# Patient Record
Sex: Female | Born: 1975 | Race: White | Hispanic: No | Marital: Married | Smoking: Never smoker
Health system: Southern US, Community
[De-identification: ages and names within clinical notes are randomized; demographics above are authoritative.]

## PROBLEM LIST (undated history)

## (undated) DIAGNOSIS — L501 Idiopathic urticaria: Secondary | ICD-10-CM

## (undated) DIAGNOSIS — R87619 Unspecified abnormal cytological findings in specimens from cervix uteri: Secondary | ICD-10-CM

## (undated) DIAGNOSIS — Q992 Fragile X chromosome: Secondary | ICD-10-CM

## (undated) DIAGNOSIS — N979 Female infertility, unspecified: Secondary | ICD-10-CM

## (undated) DIAGNOSIS — R896 Abnormal cytological findings in specimens from other organs, systems and tissues: Secondary | ICD-10-CM

## (undated) DIAGNOSIS — B977 Papillomavirus as the cause of diseases classified elsewhere: Secondary | ICD-10-CM

## (undated) DIAGNOSIS — IMO0002 Reserved for concepts with insufficient information to code with codable children: Secondary | ICD-10-CM

## (undated) DIAGNOSIS — K469 Unspecified abdominal hernia without obstruction or gangrene: Secondary | ICD-10-CM

## (undated) DIAGNOSIS — Z973 Presence of spectacles and contact lenses: Secondary | ICD-10-CM

## (undated) HISTORY — DX: Presence of spectacles and contact lenses: Z97.3

## (undated) HISTORY — DX: Unspecified abnormal cytological findings in specimens from cervix uteri: R87.619

## (undated) HISTORY — DX: Idiopathic urticaria: L50.1

## (undated) HISTORY — DX: Female infertility, unspecified: N97.9

## (undated) HISTORY — DX: Reserved for concepts with insufficient information to code with codable children: IMO0002

## (undated) HISTORY — DX: Abnormal cytological findings in specimens from other organs, systems and tissues: R89.6

## (undated) HISTORY — DX: Fragile x chromosome: Q99.2

## (undated) HISTORY — PX: ENDOMETRIAL BIOPSY: SHX622

## (undated) HISTORY — DX: Unspecified abdominal hernia without obstruction or gangrene: K46.9

## (undated) HISTORY — DX: Papillomavirus as the cause of diseases classified elsewhere: B97.7

---
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
## 2010-10-06 DIAGNOSIS — IMO0001 Reserved for inherently not codable concepts without codable children: Secondary | ICD-10-CM

## 2010-10-06 DIAGNOSIS — B977 Papillomavirus as the cause of diseases classified elsewhere: Secondary | ICD-10-CM

## 2010-10-06 HISTORY — DX: Papillomavirus as the cause of diseases classified elsewhere: B97.7

## 2010-10-06 HISTORY — DX: Reserved for inherently not codable concepts without codable children: IMO0001

## 2010-11-10 ENCOUNTER — Other Ambulatory Visit: Payer: Self-pay | Admitting: Gynecology

## 2010-11-10 ENCOUNTER — Ambulatory Visit (INDEPENDENT_AMBULATORY_CARE_PROVIDER_SITE_OTHER): Payer: Self-pay | Admitting: Gynecology

## 2010-11-10 ENCOUNTER — Other Ambulatory Visit (HOSPITAL_COMMUNITY)
Admission: RE | Admit: 2010-11-10 | Discharge: 2010-11-10 | Disposition: A | Discharge status: Home / Self Care | Payer: BC Managed Care – PPO | Source: Ambulatory Visit | Attending: Gynecology | Admitting: Gynecology

## 2010-11-10 DIAGNOSIS — Z833 Family history of diabetes mellitus: Secondary | ICD-10-CM

## 2010-11-10 DIAGNOSIS — R87619 Unspecified abnormal cytological findings in specimens from cervix uteri: Secondary | ICD-10-CM | POA: Insufficient documentation

## 2010-11-10 DIAGNOSIS — Z13220 Encounter for screening for lipoid disorders: Secondary | ICD-10-CM

## 2010-11-10 DIAGNOSIS — Z01419 Encounter for gynecological examination (general) (routine) without abnormal findings: Secondary | ICD-10-CM

## 2010-11-20 ENCOUNTER — Encounter (INDEPENDENT_AMBULATORY_CARE_PROVIDER_SITE_OTHER): Payer: Self-pay | Admitting: Surgery

## 2010-11-20 ENCOUNTER — Ambulatory Visit (INDEPENDENT_AMBULATORY_CARE_PROVIDER_SITE_OTHER): Payer: BC Managed Care – PPO | Admitting: Surgery

## 2010-11-20 VITALS — BP 98/66 | HR 90 | Temp 99.2°F | Ht 67.0 in | Wt 140.6 lb

## 2010-11-20 DIAGNOSIS — R1909 Other intra-abdominal and pelvic swelling, mass and lump: Secondary | ICD-10-CM | POA: Insufficient documentation

## 2010-11-20 DIAGNOSIS — R1900 Intra-abdominal and pelvic swelling, mass and lump, unspecified site: Secondary | ICD-10-CM

## 2010-11-20 NOTE — Progress Notes (Signed)
Kathryn Walters is a 35 y.o. female.    Chief Complaint  Patient presents with  . Other    eval fem hernia    HPI HPI This is a pleasant 35 year old female who presents with a mass in her right groin. She has had this mass over a year. He was evaluated when she was living in [place] with an MRI. This appears to be a benign cystic mass and not a hernia. She showed me the reports in the office. The mass causes no discomfort. It has not been getting any larger.  Past Medical History  Diagnosis Date  . Wears glasses   . Hernia     Past Surgical History  Procedure Date  . Tonsillectomy 1982  . Left foot surgery 1989    Family History  Problem Relation Age of Onset  . Hypertension Mother     Social History History  Substance Use Topics  . Smoking status: Never Smoker   . Smokeless tobacco: Not on file  . Alcohol Use: 0.6 oz/week    1 Glasses of wine per week    Allergies  Allergen Reactions  . Aspirin Swelling  . Ibuprofen Swelling    No current outpatient prescriptions on file.    Review of Systems Review of Systems  Constitutional: Negative.   HENT: Negative.   Eyes: Negative.   Cardiovascular: Negative.   Gastrointestinal: Negative.   Genitourinary: Negative.   Musculoskeletal: Negative.   Skin: Negative.   Neurological: Negative.   Endo/Heme/Allergies: Negative.   Psychiatric/Behavioral: Negative.    Her 16 point review of systems is negative. Physical Exam Physical Exam  Constitutional: She appears well-developed and well-nourished. No distress.  HENT:  Head: Normocephalic and atraumatic.  Right Ear: External ear normal.  Left Ear: External ear normal.  Nose: Nose normal.  Mouth/Throat: Oropharynx is clear and moist. No oropharyngeal exudate.  Eyes: Conjunctivae and EOM are normal. Pupils are equal, round, and reactive to light. No scleral icterus.  Neck: Normal range of motion. Neck supple. No tracheal deviation present. No thyromegaly present.    Cardiovascular: Normal rate and regular rhythm.   Respiratory: Effort normal and breath sounds normal. No respiratory distress. She has no wheezes.  GI: Soft. Bowel sounds are normal. She exhibits mass. She exhibits no distension. There is no tenderness.       Small nontender mass just below right inguinal ligament. mobile  Skin: Skin is warm and dry. No rash noted. No erythema.  Psychiatric: Her behavior is normal. Thought content normal.     Blood pressure 98/66, pulse 90, temperature 99.2 F (37.3 C), height 5\' 7"  (1.702 m), weight 140 lb 9.6 oz (63.776 kg).  Assessment/Plan This is a 35 year old female with a right groin mass which I suspect is a benign cyst although I cannot totally rule out a femoral hernia. Again this is nontender. She incidentally does also have anal skin tags. I believe it is reasonable to follow this cyst expectantly as she is not here to have it excised. She is currently trying to get pregnant. I will place her on steroid cream to see if a will shrink skin tags. She will call back should she decide to have been removed the groin cyst.  Nnenna Meador A 11/20/2010, 4:42 PM

## 2011-05-02 ENCOUNTER — Ambulatory Visit (INDEPENDENT_AMBULATORY_CARE_PROVIDER_SITE_OTHER): Payer: BC Managed Care – PPO | Admitting: Gynecology

## 2011-05-02 ENCOUNTER — Other Ambulatory Visit (HOSPITAL_COMMUNITY)
Admission: RE | Admit: 2011-05-02 | Discharge: 2011-05-02 | Disposition: A | Discharge status: Home / Self Care | Payer: BC Managed Care – PPO | Source: Ambulatory Visit | Attending: Gynecology | Admitting: Gynecology

## 2011-05-02 ENCOUNTER — Encounter: Payer: Self-pay | Admitting: Gynecology

## 2011-05-02 VITALS — BP 104/68

## 2011-05-02 DIAGNOSIS — Z01419 Encounter for gynecological examination (general) (routine) without abnormal findings: Secondary | ICD-10-CM | POA: Insufficient documentation

## 2011-05-02 DIAGNOSIS — R87610 Atypical squamous cells of undetermined significance on cytologic smear of cervix (ASC-US): Secondary | ICD-10-CM

## 2011-05-02 NOTE — Progress Notes (Signed)
Patient presents for Pap smear. She has history of low-grade SIL done elsewhere with colposcopy and they were following her expectantly.  She had a Pap smear June 2012 which was ASCUS with positive high-risk HPV and I recommended she follow up for repeat Pap smear in 6 months.  Exam External BUS vagina normal. Cervix normal Pap done.   Assessment and plan: History of low-grade SIL. Most recent Pap smear showed ASCUS with positive high-risk HPV in June 2012. Pap done today. Persistent abnormal then we'll recommend colposcopy. If normal then we'll recommend repeat in 6 months at her annual. Patient understands the plan. We had a long discussion about dysplasia high-grade low-grade progression regression positive HPV Association.

## 2011-05-02 NOTE — Patient Instructions (Signed)
Follow up for Pap smear results. If abnormal then we'll plan colposcopy as discussed.

## 2011-05-08 DIAGNOSIS — IMO0002 Reserved for concepts with insufficient information to code with codable children: Secondary | ICD-10-CM

## 2011-05-08 HISTORY — DX: Reserved for concepts with insufficient information to code with codable children: IMO0002

## 2011-05-08 HISTORY — PX: COLPOSCOPY: SHX161

## 2011-05-21 ENCOUNTER — Encounter: Payer: Self-pay | Admitting: Gynecology

## 2011-05-21 ENCOUNTER — Ambulatory Visit (INDEPENDENT_AMBULATORY_CARE_PROVIDER_SITE_OTHER): Payer: BC Managed Care – PPO | Admitting: Gynecology

## 2011-05-21 DIAGNOSIS — IMO0002 Reserved for concepts with insufficient information to code with codable children: Secondary | ICD-10-CM | POA: Insufficient documentation

## 2011-05-21 DIAGNOSIS — R87612 Low grade squamous intraepithelial lesion on cytologic smear of cervix (LGSIL): Secondary | ICD-10-CM

## 2011-05-21 NOTE — Patient Instructions (Signed)
Follow up for biopsy results. If low-grade atypia then we'll plan repeat Pap smear July 2013 when you're due for your annual exam. High-grade atypia then we'll discuss treatment options.

## 2011-05-21 NOTE — Progress Notes (Signed)
Patient ID: Kathryn Walters, female   DOB: [birth year], 36 y.o.   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 This was done in [place]. 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 She her Pap smear repeated now in December and it showed low-grade SIL she presents for colposcopy.  Colposcopy with Sherri chaperone assisting Adequate after acetic acid cleansing with a small area of acetowhite change at the 12:00 transformation zone. Biopsy of this area taken with follow up ECC. Patient tolerated well Physical Exam  Genitourinary:     Assessment and plan: Low-grade SIL Pap smear, acetowhite change at 12:00 biopsy taken ECC performed. Patient will follow up for biopsy results.  If low grade or normal then we'll plan expectant management with Pap smear in 6 months when she is due for her annual exam. High-grade that we'll discuss treatment options.  The issues of dysplasia, high-grade low-grade, progression or regression, HPV association were all reviewed again with her.  Importance of follow up was stressed.

## 2011-09-17 ENCOUNTER — Encounter (INDEPENDENT_AMBULATORY_CARE_PROVIDER_SITE_OTHER): Payer: Self-pay | Admitting: Surgery

## 2011-09-17 ENCOUNTER — Ambulatory Visit (INDEPENDENT_AMBULATORY_CARE_PROVIDER_SITE_OTHER): Payer: BC Managed Care – PPO | Admitting: Surgery

## 2011-09-17 VITALS — BP 109/64 | HR 72 | Temp 98.7°F | Resp 12 | Ht 67.0 in | Wt 135.4 lb

## 2011-09-17 DIAGNOSIS — R1909 Other intra-abdominal and pelvic swelling, mass and lump: Secondary | ICD-10-CM

## 2011-09-17 NOTE — Progress Notes (Signed)
Subjective:     Patient ID: Kathryn Walters, female   DOB: 1976/04/29, 36 y.o.   MRN: [id number]  HPI She is here for a followup regarding her right groin mass. This was diagnosed in [place] and I saw her last year regarding it. She had an MRI which showed this to be a possible cyst. It did not appear like a hernia. She has had no discomfort since I saw her last.   originally she wanted to follow this expectantly  Review of Systems     Objective:   Physical Exam    Findings exam, there is a 2 cm soft mobile mass just below the inguinal ligament on the right side and immediately Assessment:     Right groin mass of uncertain etiology    Plan:     Removal in the operating room as recommended. I could not totally rule out that this is not a femoral hernia. I explained the risks of surgery to her which includes but is not limited to bleeding, infection, recurrence, chronic pain, need to use mesh and is being hernia, etc. She understands and wishes to proceed. Likelihood of success is good

## 2011-10-22 ENCOUNTER — Encounter (HOSPITAL_COMMUNITY): Payer: Self-pay | Admitting: Pharmacy Technician

## 2011-10-22 ENCOUNTER — Other Ambulatory Visit (HOSPITAL_COMMUNITY): Payer: BC Managed Care – PPO

## 2011-10-23 ENCOUNTER — Encounter (HOSPITAL_COMMUNITY)
Admission: RE | Admit: 2011-10-23 | Discharge: 2011-10-23 | Disposition: A | Discharge status: Home / Self Care | Payer: BC Managed Care – PPO | Source: Ambulatory Visit | Attending: Surgery | Admitting: Surgery

## 2011-10-23 ENCOUNTER — Encounter (HOSPITAL_COMMUNITY): Payer: Self-pay

## 2011-10-23 ENCOUNTER — Inpatient Hospital Stay (HOSPITAL_COMMUNITY): Admission: RE | Admit: 2011-10-23 | Payer: BC Managed Care – PPO | Source: Ambulatory Visit

## 2011-10-23 LAB — CBC
HCT: 36.5 % (ref 36.0–46.0)
MCHC: 33.7 g/dL (ref 30.0–36.0)
Platelets: 325 10*3/uL (ref 150–400)
RDW: 12.6 % (ref 11.5–15.5)
WBC: 4.6 10*3/uL (ref 4.0–10.5)

## 2011-10-23 LAB — SURGICAL PCR SCREEN: Staphylococcus aureus: POSITIVE — AB

## 2011-10-23 LAB — HCG, SERUM, QUALITATIVE: Preg, Serum: NEGATIVE

## 2011-10-23 NOTE — Patient Instructions (Signed)
20 Kathryn Walters  10/23/2011   Your procedure is scheduled on:  10/25/11 0900am-1005am  Report to [facility] at 0630 AM.  Call this number if you have problems the morning of surgery: [phone]   Remember:   Do not eat food:After Midnight.  May have clear liquids:until Midnight .  Marland Kitchen  Take these medicines the morning of surgery with A SIP OF WATER:    Do not wear jewelry, make-up or nail polish.  Do not wear lotions, powders, or perfumes.   Do not shave 48 hours prior to surgery. .  Do not bring valuables to the hospital.  Contacts, dentures or bridgework may not be worn into surgery.      Patients discharged the day of surgery will not be allowed to drive home.  Name and phone number of your driver:  Special Instructions: CHG Shower Use Special Wash: 1/2 bottle night before surgery and 1/2 bottle morning of surgery. shower chin to toes with CHG.  Wash face and private parts with regular soap.    Please read over the following fact sheets that you were given: MRSA Information, coughing and deep breathing exercises, leg exercises

## 2011-10-23 NOTE — Pre-Procedure Instructions (Signed)
Teach Back Method used for preop appointment 

## 2011-10-24 NOTE — H&P (Signed)
  HPI  HPI  This is a pleasant 36 year old female who presents with a mass in her right groin. She has had this mass over a year. She was evaluated when she was living in [place] with an MRI. This appears to be a benign cystic mass and not a hernia. She showed me the reports in the office. The mass causes no discomfort. It has not been getting any larger.  Past Medical History   Diagnosis  Date   .  Wears glasses    .  Hernia     Past Surgical History   Procedure  Date   .  Tonsillectomy  1982   .  Left foot surgery  1989    Family History   Problem  Relation  Age of Onset   .  Hypertension  Mother     Social History  History   Substance Use Topics   .  Smoking status:  Never Smoker   .  Smokeless tobacco:  Not on file   .  Alcohol Use:  0.6 oz/week     1 Glasses of wine per week    Allergies   Allergen  Reactions   .  Aspirin  Swelling   .  Ibuprofen  Swelling    No current outpatient prescriptions on file.    Review of Systems  Review of Systems  Constitutional: Negative.  HENT: Negative.  Eyes: Negative.  Cardiovascular: Negative.  Gastrointestinal: Negative.  Genitourinary: Negative.  Musculoskeletal: Negative.  Skin: Negative.  Neurological: Negative.  Endo/Heme/Allergies: Negative.  Psychiatric/Behavioral: Negative.   Her 16 point review of systems is negative.  Physical Exam  Physical Exam  Constitutional: She appears well-developed and well-nourished. No distress.  HENT:  Head: Normocephalic and atraumatic.  Right Ear: External ear normal.  Left Ear: External ear normal.  Nose: Nose normal.  Mouth/Throat: Oropharynx is clear and moist. No oropharyngeal exudate.  Eyes: Conjunctivae and EOM are normal. Pupils are equal, round, and reactive to light. No scleral icterus.  Neck: Normal range of motion. Neck supple. No tracheal deviation present. No thyromegaly present.  Cardiovascular: Normal rate and regular rhythm.  Respiratory: Effort normal and breath  sounds normal. No respiratory distress. She has no wheezes.  GI: Soft. Bowel sounds are normal. She exhibits mass. She exhibits no distension. There is no tenderness.  Small nontender mass just below right inguinal ligament. mobile  Skin: Skin is warm and dry. No rash noted. No erythema.  Psychiatric: Her behavior is normal. Thought content normal.   Blood pressure 98/66, pulse 90, temperature 99.2 F (37.3 C), height 5\' 7"  (1.702 m), weight 140 lb 9.6 oz (63.776 kg).   Assessment/Plan:  Right groin mass of uncertain etiology.  Removal recommended for histologic evaluation to rule out malignancy.  Risks of bleeding, infection, recurrence, seroma, injury, chronic pain, etc discussed.  Likelihood of success is good.

## 2011-10-25 ENCOUNTER — Encounter (HOSPITAL_COMMUNITY): Payer: Self-pay | Admitting: Anesthesiology

## 2011-10-25 ENCOUNTER — Encounter (HOSPITAL_COMMUNITY): Payer: Self-pay | Admitting: *Deleted

## 2011-10-25 ENCOUNTER — Encounter (HOSPITAL_COMMUNITY)
Admission: RE | Disposition: A | Discharge status: Home / Self Care | Payer: Self-pay | Source: Ambulatory Visit | Attending: Surgery

## 2011-10-25 ENCOUNTER — Ambulatory Visit (HOSPITAL_COMMUNITY): Payer: BC Managed Care – PPO | Admitting: Anesthesiology

## 2011-10-25 ENCOUNTER — Ambulatory Visit (HOSPITAL_COMMUNITY)
Admission: RE | Admit: 2011-10-25 | Discharge: 2011-10-25 | Disposition: A | Discharge status: Home / Self Care | Payer: BC Managed Care – PPO | Source: Ambulatory Visit | Attending: Surgery | Admitting: Surgery

## 2011-10-25 DIAGNOSIS — Z01812 Encounter for preprocedural laboratory examination: Secondary | ICD-10-CM | POA: Insufficient documentation

## 2011-10-25 DIAGNOSIS — L723 Sebaceous cyst: Secondary | ICD-10-CM | POA: Insufficient documentation

## 2011-10-25 DIAGNOSIS — R1903 Right lower quadrant abdominal swelling, mass and lump: Secondary | ICD-10-CM | POA: Insufficient documentation

## 2011-10-25 HISTORY — PX: MASS EXCISION: SHX2000

## 2011-10-25 SURGERY — EXCISION MASS
Anesthesia: General | Site: Groin | Wound class: Clean

## 2011-10-25 MED ORDER — LACTATED RINGERS IV SOLN
INTRAVENOUS | Status: DC
  Administered 2011-10-25: 1000 mL via INTRAVENOUS

## 2011-10-25 MED ORDER — LACTATED RINGERS IV SOLN
INTRAVENOUS | Status: DC | PRN
  Administered 2011-10-25: 09:00:00 via INTRAVENOUS

## 2011-10-25 MED ORDER — PROPOFOL 10 MG/ML IV BOLUS
INTRAVENOUS | Status: DC | PRN
  Administered 2011-10-25: 150 mg via INTRAVENOUS

## 2011-10-25 MED ORDER — MORPHINE SULFATE 10 MG/ML IJ SOLN: 2.0000 mg | INTRAMUSCULAR | Status: DC | PRN

## 2011-10-25 MED ORDER — ACETAMINOPHEN 325 MG PO TABS: 650.0000 mg | ORAL_TABLET | ORAL | Status: DC | PRN

## 2011-10-25 MED ORDER — ONDANSETRON HCL 4 MG/2ML IJ SOLN
INTRAMUSCULAR | Status: DC | PRN
  Administered 2011-10-25: 4 mg via INTRAVENOUS

## 2011-10-25 MED ORDER — FENTANYL CITRATE 0.05 MG/ML IJ SOLN: 25.0000 ug | INTRAMUSCULAR | Status: DC | PRN

## 2011-10-25 MED ORDER — HYDROCODONE-ACETAMINOPHEN 5-325 MG PO TABS: 1.0000 | ORAL_TABLET | ORAL | Status: AC | PRN

## 2011-10-25 MED ORDER — OXYCODONE HCL 5 MG PO TABS: 5.0000 mg | ORAL_TABLET | ORAL | Status: DC | PRN

## 2011-10-25 MED ORDER — PROMETHAZINE HCL 25 MG/ML IJ SOLN: 6.2500 mg | INTRAMUSCULAR | Status: DC | PRN

## 2011-10-25 MED ORDER — LIDOCAINE HCL (CARDIAC) 20 MG/ML IV SOLN
INTRAVENOUS | Status: DC | PRN
  Administered 2011-10-25: 50 mg via INTRAVENOUS

## 2011-10-25 MED ORDER — BUPIVACAINE HCL (PF) 0.5 % IJ SOLN
INTRAMUSCULAR | Status: AC
  Filled 2011-10-25: qty 30

## 2011-10-25 MED ORDER — SODIUM CHLORIDE 0.9 % IJ SOLN: 3.0000 mL | INTRAMUSCULAR | Status: DC | PRN

## 2011-10-25 MED ORDER — ONDANSETRON HCL 4 MG/2ML IJ SOLN: 4.0000 mg | Freq: Four times a day (QID) | INTRAMUSCULAR | Status: DC | PRN

## 2011-10-25 MED ORDER — MIDAZOLAM HCL 5 MG/5ML IJ SOLN
INTRAMUSCULAR | Status: DC | PRN
  Administered 2011-10-25: 2 mg via INTRAVENOUS

## 2011-10-25 MED ORDER — LACTATED RINGERS IV SOLN: INTRAVENOUS | Status: DC

## 2011-10-25 MED ORDER — ACETAMINOPHEN 650 MG RE SUPP
650.0000 mg | RECTAL | Status: DC | PRN
  Filled 2011-10-25: qty 1

## 2011-10-25 MED ORDER — CEFAZOLIN SODIUM 1-5 GM-% IV SOLN
1.0000 g | INTRAVENOUS | Status: AC
  Administered 2011-10-25: 1 g via INTRAVENOUS

## 2011-10-25 MED ORDER — SODIUM CHLORIDE 0.9 % IV SOLN: 250.0000 mL | INTRAVENOUS | Status: DC | PRN

## 2011-10-25 MED ORDER — BUPIVACAINE HCL (PF) 0.5 % IJ SOLN
INTRAMUSCULAR | Status: DC | PRN
  Administered 2011-10-25: 10 mL

## 2011-10-25 MED ORDER — SODIUM CHLORIDE 0.9 % IJ SOLN: 3.0000 mL | Freq: Two times a day (BID) | INTRAMUSCULAR | Status: DC

## 2011-10-25 MED ORDER — ACETAMINOPHEN 10 MG/ML IV SOLN
INTRAVENOUS | Status: DC | PRN
  Administered 2011-10-25: 1000 mg via INTRAVENOUS

## 2011-10-25 SURGICAL SUPPLY — 38 items
BLADE HEX COATED 2.75 (ELECTRODE) ×3 IMPLANT
BLADE SURG 15 STRL LF DISP TIS (BLADE) ×2 IMPLANT
BLADE SURG 15 STRL SS (BLADE) ×1
BLADE SURG SZ10 CARB STEEL (BLADE) ×3 IMPLANT
CANISTER SUCTION 2500CC (MISCELLANEOUS) ×3 IMPLANT
CHLORAPREP W/TINT 26ML (MISCELLANEOUS) ×3 IMPLANT
CLOTH BEACON ORANGE TIMEOUT ST (SAFETY) ×3 IMPLANT
DECANTER SPIKE VIAL GLASS SM (MISCELLANEOUS) IMPLANT
DERMABOND ADVANCED (GAUZE/BANDAGES/DRESSINGS) ×2
DERMABOND ADVANCED .7 DNX12 (GAUZE/BANDAGES/DRESSINGS) ×4 IMPLANT
DRAIN PENROSE 18X1/2 LTX STRL (DRAIN) ×3 IMPLANT
DRAPE LAPAROTOMY TRNSV 102X78 (DRAPE) ×3 IMPLANT
ELECT REM PT RETURN 9FT ADLT (ELECTROSURGICAL) ×3
ELECTRODE REM PT RTRN 9FT ADLT (ELECTROSURGICAL) ×2 IMPLANT
GAUZE SPONGE 4X4 16PLY XRAY LF (GAUZE/BANDAGES/DRESSINGS) IMPLANT
GLOVE SURG SIGNA 7.5 PF LTX (GLOVE) ×6 IMPLANT
GOWN STRL NON-REIN LRG LVL3 (GOWN DISPOSABLE) ×3 IMPLANT
GOWN STRL REIN XL XLG (GOWN DISPOSABLE) ×6 IMPLANT
KIT BASIN OR (CUSTOM PROCEDURE TRAY) ×3 IMPLANT
NEEDLE HYPO 22GX1.5 SAFETY (NEEDLE) ×3 IMPLANT
NEEDLE HYPO 25X1 1.5 SAFETY (NEEDLE) ×3 IMPLANT
NS IRRIG 1000ML POUR BTL (IV SOLUTION) ×3 IMPLANT
PACK BASIC VI WITH GOWN DISP (CUSTOM PROCEDURE TRAY) ×3 IMPLANT
PENCIL BUTTON HOLSTER BLD 10FT (ELECTRODE) ×3 IMPLANT
SPONGE GAUZE 4X4 12PLY (GAUZE/BANDAGES/DRESSINGS) IMPLANT
SPONGE LAP 4X18 X RAY DECT (DISPOSABLE) ×3 IMPLANT
STRIP CLOSURE SKIN 1/2X4 (GAUZE/BANDAGES/DRESSINGS) IMPLANT
SUT MNCRL AB 4-0 PS2 18 (SUTURE) ×3 IMPLANT
SUT VIC AB 2-0 CT1 27 (SUTURE)
SUT VIC AB 2-0 CT1 TAPERPNT 27 (SUTURE) IMPLANT
SUT VIC AB 3-0 54XBRD REEL (SUTURE) IMPLANT
SUT VIC AB 3-0 BRD 54 (SUTURE)
SUT VIC AB 3-0 SH 27 (SUTURE) ×1
SUT VIC AB 3-0 SH 27XBRD (SUTURE) ×2 IMPLANT
SYR BULB IRRIGATION 50ML (SYRINGE) ×3 IMPLANT
SYR CONTROL 10ML LL (SYRINGE) ×3 IMPLANT
TOWEL OR 17X26 10 PK STRL BLUE (TOWEL DISPOSABLE) ×3 IMPLANT
YANKAUER SUCT BULB TIP 10FT TU (MISCELLANEOUS) ×3 IMPLANT

## 2011-10-25 NOTE — Anesthesia Preprocedure Evaluation (Signed)
Anesthesia Evaluation  Patient identified by MRN, date of birth, ID band Patient awake    Reviewed: Allergy & Precautions, H&P , NPO status , Patient's Chart, lab work & pertinent test results  Airway Mallampati: II TM Distance: <3 FB Neck ROM: Full    Dental No notable dental hx.    Pulmonary neg pulmonary ROS,  breath sounds clear to auscultation  Pulmonary exam normal       Cardiovascular negative cardio ROS  Rhythm:Regular Rate:Normal     Neuro/Psych negative neurological ROS  negative psych ROS   GI/Hepatic negative GI ROS, Neg liver ROS,   Endo/Other  negative endocrine ROS  Renal/GU negative Renal ROS  negative genitourinary   Musculoskeletal negative musculoskeletal ROS (+)   Abdominal   Peds negative pediatric ROS (+)  Hematology negative hematology ROS (+)   Anesthesia Other Findings   Reproductive/Obstetrics negative OB ROS                           Anesthesia Physical Anesthesia Plan  ASA: I  Anesthesia Plan: General   Post-op Pain Management:    Induction: Intravenous  Airway Management Planned: LMA  Additional Equipment:   Intra-op Plan:   Post-operative Plan:   Informed Consent: I have reviewed the patients History and Physical, chart, labs and discussed the procedure including the risks, benefits and alternatives for the proposed anesthesia with the patient or authorized representative who has indicated his/her understanding and acceptance.   Dental advisory given  Plan Discussed with: CRNA  Anesthesia Plan Comments:         Anesthesia Quick Evaluation  

## 2011-10-25 NOTE — Discharge Instructions (Addendum)
Ice pack as needed for pain  May shower starting tomorrow  No soaking in tub or swimming for 1 weekExcision of Tumor with Frozen Section You are scheduled to have a total removal or a piece (biopsy) of a lump or bump (tumor) removed. Following the removal of the tumor, pieces of it will be sent to the lab. At the lab a specialist (pathologist) will freeze the biopsy and it will be prepared so that it can be looked at under a microscope. This is an instrument that lets the specialist determine what type of cells make up the tumor or lump. They can tell if the lump is benign or cancerous. If the lump is cancerous, they can also determine if the cancer has grown beyond the margins of what has been removed. If this has happened it may mean that more extensive removal of the tumor must be done. Following the removal of the tumor, you will be returned to your room and when you are awake and doing well you will be allowed to go home.  Make sure you are aware how you are to get any final results on tests done and what you are supposed to do in follow-up.  Document Released: 01/17/2005 Document Revised: 04/12/2011 Document Reviewed: 08/19/2007 [facility] Patient Information 2012 [place], [place].

## 2011-10-25 NOTE — Interval H&P Note (Signed)
History and Physical Interval Note:  No change  10/25/2011 8:08 AM  Kathryn Walters  has presented today for surgery, with the diagnosis of Right groin mass  The various methods of treatment have been discussed with the patient and family. After consideration of risks, benefits and other options for treatment, the patient has consented to  Procedure(s) (LRB): EXCISION MASS right groin (N/A), possible INSERTION OF MESH (N/A) as a surgical intervention .  The patient's history has been reviewed, patient examined, no change in status, stable for surgery.  I have reviewed the patients' chart and labs.  Questions were answered to the patient's satisfaction.     Mamta Rimmer A

## 2011-10-25 NOTE — Op Note (Signed)
EXCISION MASS  Procedure Note  Kathryn Walters 10/25/2011   Pre-op Diagnosis: Right groin mass     Post-op Diagnosis: 3 cm right groin cystic mass  Procedure(s): EXCISION 3 CM RIGHT GROIN CYSTIC MASS   Surgeon(s): Shelly Rubenstein, MD  Anesthesia: General  Staff:  Jaynee Eagles - Circulator April C McKinney, CST - Scrub Person [place], [place] - Scrub Person  Estimated Blood Loss: Minimal               Specimens: cystic mass sent to path         Findings: Patient was found to have a 3 cm cystic mass in the right groin below the inguinal ligament. I could not find evidence that this was a femoral hernia.  Procedure: The patient was brought to the operating room and identified as the correct patient. She was placed supine on the operating room table and general anesthesia was induced. Her right groin was then prepped and draped in the usual sterile fashion. I anesthetized the skin over the palpable mass with Marcaine. I made a small incision with a scalpel and took this down through the subcutaneous tissue with electrocautery. The cystic mass became easily apparent. It contained clear fluid. I excised it in its entirety. I could not find it and indication with the intra-abdominal cavity or a femoral hernia. The cystic mass was minimally fixed to the surrounding structures. Once it was excised it was sent to pathology for evaluation. Hemostasis was achieved with the cautery. I then closed the subcutaneous tissue with interrupted 3-0 Vicryl sutures and closed the skin with a running 4-0 Monocryl suture. I then used Dermabond on the skin. The patient tolerated the procedure well. All the counts were correct at the end of the procedure. The patient was then extubated in the operating room and taken in stable condition to the recovery room. [name] A   Date: 10/25/2011  Time: 9:07 AM

## 2011-10-25 NOTE — Transfer of Care (Signed)
Immediate Anesthesia Transfer of Care Note  Patient: Kathryn Walters  Procedure(s) Performed: Procedure(s) (LRB): EXCISION MASS (N/A)  Patient Location: PACU  Anesthesia Type: General  Level of Consciousness: sedated, patient cooperative and responds to stimulaton  Airway & Oxygen Therapy: Patient Spontanous Breathing and Patient connected to face mask oxgen  Post-op Assessment: Report given to PACU RN and Post -op Vital signs reviewed and stable  Post vital signs: Reviewed and stable  Complications: No apparent anesthesia complications

## 2011-10-25 NOTE — Progress Notes (Signed)
1150 Did not give the patient the information at discharge on frozen section of tumor gave what Dr. Magnus Ivan wrote

## 2011-10-25 NOTE — Anesthesia Postprocedure Evaluation (Signed)
  Anesthesia Post-op Note  Patient: Kathryn Walters  Procedure(s) Performed: Procedure(s) (LRB): EXCISION MASS (N/A)  Patient Location: PACU  Anesthesia Type: General  Level of Consciousness: awake and alert   Airway and Oxygen Therapy: Patient Spontanous Breathing  Post-op Pain: mild  Post-op Assessment: Post-op Vital signs reviewed, Patient's Cardiovascular Status Stable, Respiratory Function Stable, Patent Airway and No signs of Nausea or vomiting  Post-op Vital Signs: stable  Complications: No apparent anesthesia complications

## 2011-10-25 NOTE — Progress Notes (Signed)
1100 Am,bulated without any problems to the bathroom

## 2011-10-26 ENCOUNTER — Encounter (HOSPITAL_COMMUNITY): Payer: Self-pay | Admitting: Surgery

## 2011-11-09 ENCOUNTER — Encounter (INDEPENDENT_AMBULATORY_CARE_PROVIDER_SITE_OTHER): Payer: Self-pay | Admitting: Surgery

## 2011-11-09 ENCOUNTER — Ambulatory Visit (INDEPENDENT_AMBULATORY_CARE_PROVIDER_SITE_OTHER): Payer: BC Managed Care – PPO | Admitting: Surgery

## 2011-11-09 VITALS — BP 102/64 | HR 60 | Temp 97.6°F | Resp 12 | Ht 65.0 in | Wt 131.6 lb

## 2011-11-09 DIAGNOSIS — Z09 Encounter for follow-up examination after completed treatment for conditions other than malignant neoplasm: Secondary | ICD-10-CM

## 2011-11-09 NOTE — Progress Notes (Signed)
Subjective:     Patient ID: Kathryn Walters, female   DOB: October 26, 1975, 36 y.o.   MRN: [id number]  HPI She is here for her first postop visit status post excision of a benign cyst from the groin. She has no complaints.  Review of Systems     Objective:   Physical Exam On exam, her incision is well-healed. The final pathology showed a benign cyst with no evidence of malignancy    Assessment:     Patient status post excision of symptomatic benign right groin cyst    Plan:     I will see her back as needed

## 2012-03-21 ENCOUNTER — Encounter: Payer: Self-pay | Admitting: Gynecology

## 2012-03-21 ENCOUNTER — Other Ambulatory Visit (HOSPITAL_COMMUNITY)
Admission: RE | Admit: 2012-03-21 | Discharge: 2012-03-21 | Disposition: A | Discharge status: Home / Self Care | Payer: BC Managed Care – PPO | Source: Ambulatory Visit | Attending: Gynecology | Admitting: Gynecology

## 2012-03-21 ENCOUNTER — Ambulatory Visit (INDEPENDENT_AMBULATORY_CARE_PROVIDER_SITE_OTHER): Payer: BC Managed Care – PPO | Admitting: Gynecology

## 2012-03-21 VITALS — BP 110/68 | Ht 65.0 in | Wt 131.0 lb

## 2012-03-21 DIAGNOSIS — Z1151 Encounter for screening for human papillomavirus (HPV): Secondary | ICD-10-CM | POA: Insufficient documentation

## 2012-03-21 DIAGNOSIS — IMO0002 Reserved for concepts with insufficient information to code with codable children: Secondary | ICD-10-CM

## 2012-03-21 DIAGNOSIS — Z01419 Encounter for gynecological examination (general) (routine) without abnormal findings: Secondary | ICD-10-CM

## 2012-03-21 DIAGNOSIS — R6889 Other general symptoms and signs: Secondary | ICD-10-CM

## 2012-03-21 LAB — CBC WITH DIFFERENTIAL/PLATELET
Basophils Absolute: 0 10*3/uL (ref 0.0–0.1)
Eosinophils Absolute: 0.1 10*3/uL (ref 0.0–0.7)
Eosinophils Relative: 1 % (ref 0–5)
HCT: 34.9 % — ABNORMAL LOW (ref 36.0–46.0)
MCH: 32.1 pg (ref 26.0–34.0)
MCV: 93.3 fL (ref 78.0–100.0)
Monocytes Absolute: 0.5 10*3/uL (ref 0.1–1.0)
Platelets: 349 10*3/uL (ref 150–400)
RDW: 13.2 % (ref 11.5–15.5)

## 2012-03-21 NOTE — Progress Notes (Signed)
Kathryn Walters 19-Jul-1975 [id number]        36 y.o.  G0P0 for annual exam.    Past medical history,surgical history, medications, allergies, family history and social history were all reviewed and documented in the EPIC chart. ROS:  Was performed and pertinent positives and negatives are included in the history.  Exam: Charity fundraiser Vitals:   03/21/12 1429  BP: 110/68  Height: 5\' 5"  (1.651 m)  Weight: 131 lb (59.421 kg)   General appearance  Normal Skin grossly normal Head/Neck normal with no cervical or supraclavicular adenopathy thyroid normal Lungs  clear Cardiac RR, without RMG Abdominal  soft, nontender, without masses, organomegaly or hernia Breasts  examined lying and sitting without masses, retractions, discharge or axillary adenopathy. Pelvic  Ext/BUS/vagina  normal   Cervix  normal Pap/HPV  Uterus  anteverted, normal size, shape and contour, midline and mobile nontender   Adnexa  Without masses or tenderness    Anus and perineum  normal   Rectovaginal  normal sphincter tone without palpated masses or tenderness.    Assessment/Plan:  36 y.o. G0P0 female for annual exam, regular menses, no birth control.   1. LGSIL.  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 Follow up Pap smear December 2012 showed LGSIL and follow up colposcopy January 2013 showed biopsy LGSIL with negative ECC. Pap/HPV done today. If negative or low grade plan repeat in 1 year. Otherwise triage based on results. 2. Breast self. SBE monthly reviewed. Screening mammographic recommendations between 35 and 40 discussed. Patient has no strong family history of breast cancer prefers to lay close to 40. 3. Infertility. Patient's undergone evaluation both in [place] and subsequently by Dr. Elesa Hacker here. Next up would be IVF which they do not  want to do.  Not using contraception and would accept pregnancy. I did recommend prenatal vitamin now she agrees to start. Offered referral to Dr. April Manson for second opinion as well as discussed possible laparoscopy rule out endometriosis as she's never had one at this point she's not looking to actively pursue any further treatment. 4. Health maintenance. Glucose lipid profile last year was normal. Recheck CBC/urinalysis now. Otherwise follow up for Pap smear results, annually, sooner as needed   Dara Lords MD, 3:13 PM 03/21/2012

## 2012-03-21 NOTE — Patient Instructions (Signed)
Follow up in one year for annual exam 

## 2012-03-22 LAB — URINALYSIS W MICROSCOPIC + REFLEX CULTURE
Bacteria, UA: NONE SEEN
Bilirubin Urine: NEGATIVE
Casts: NONE SEEN
Crystals: NONE SEEN
Hgb urine dipstick: NEGATIVE
Ketones, ur: NEGATIVE mg/dL
Nitrite: NEGATIVE
Specific Gravity, Urine: 1.022 (ref 1.005–1.030)
pH: 5 (ref 5.0–8.0)

## 2012-03-27 ENCOUNTER — Encounter: Payer: Self-pay | Admitting: Gynecology

## 2012-06-24 ENCOUNTER — Encounter: Payer: Self-pay | Admitting: Internal Medicine

## 2012-06-24 ENCOUNTER — Ambulatory Visit (INDEPENDENT_AMBULATORY_CARE_PROVIDER_SITE_OTHER): Payer: BC Managed Care – PPO | Admitting: Internal Medicine

## 2012-06-24 VITALS — BP 104/68 | HR 76 | Temp 97.9°F | Ht 66.5 in | Wt 141.0 lb

## 2012-06-24 DIAGNOSIS — Z0000 Encounter for general adult medical examination without abnormal findings: Secondary | ICD-10-CM

## 2012-06-24 NOTE — Progress Notes (Signed)
  Subjective:    Patient ID: Kathryn Walters, female    DOB: 02/20/1976, 37 y.o.   MRN: [id number]  HPI CPX, new patient, no concerns  Past Medical History  Diagnosis Date  . Wears glasses   . Hernia   . ASCUS (atypical squamous cells of undetermined significance) on Pap smear 10/2010  . 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 Idiopathic urticaria   . 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 Tonsillectomy  1982  . Foot surgery  1993    left  . Colposcopy  05/2011  . Mass excision  10/25/2011    Procedure: EXCISION MASS;  Surgeon: Shelly Rubenstein, MD;  Location: [facility];  Service: General;  Laterality: N/A;  Excision Right Groin Mass,    History   Social History  . Marital Status: Married    Spouse Name: N/A    Number of Children: 0  . Years of Education: N/A   Occupational History  . clinical trial manager    Social History Main Topics  . Smoking status: Never Smoker   . Smokeless tobacco: Never Used  . Alcohol Use: 0.6 oz/week    1 Glasses of wine, 0 Cans of beer per week  . Drug Use: No  . Sexually Active: Yes   Other Topics Concern  . Not on file   Social History Narrative   Original from [place], used to live in [place], moved to [place] ~2010, she is a MD in [place] ---   Diet: healthy most of the time ---   Exercise: no   Family History  Problem Relation Age of Onset  . Hypertension Mother   . CAD Neg Hx   . Stroke Neg Hx   . Diabetes      GM  . Colon cancer Neg Hx   . Breast cancer Neg Hx     Review of Systems No chest pain or shortness or breath No nausea, vomiting, diarrhea. No dysuria or gross hematuria. No anxiety or depression, good sleep habits.    Objective:   Physical Exam  General -- alert, well-developed, BMI ~22 .   Neck --no thyromegaly , normal carotid pulse Lungs -- normal respiratory effort, no intercostal retractions, no accessory muscle  use, and normal breath sounds.   Heart-- normal rate, regular rhythm, no murmur, and no gallop.   Abdomen--soft, non-tender, no distention, no masses, no HSM, no guarding, and no rigidity.   Extremities-- no pretibial edema bilaterally Neurologic-- alert & oriented X3 and strength normal in all extremities. Psych-- Cognition and judgment appear intact. Alert and cooperative with normal attention span and concentration.  not anxious appearing and not depressed appearing.       Assessment & Plan:

## 2012-06-24 NOTE — Assessment & Plan Note (Addendum)
Last TD? Declined one female care per gyn. Last CBC showed hemoglobin of 12, she has heavy periods, was recommended to take iron per gyn. I agree. Labs, see instructions Continue w/ healthy lifestyle Increase exercise ! Next visit 1-2 years

## 2012-06-24 NOTE — Patient Instructions (Addendum)
Please come back fasting: CBC, CMP, FLP, TSH-- dx V70 Next visit in one or 2 years. Call as needed.

## 2012-07-04 ENCOUNTER — Other Ambulatory Visit (INDEPENDENT_AMBULATORY_CARE_PROVIDER_SITE_OTHER): Payer: BC Managed Care – PPO

## 2012-07-04 ENCOUNTER — Other Ambulatory Visit: Payer: BC Managed Care – PPO

## 2012-07-04 DIAGNOSIS — Z0000 Encounter for general adult medical examination without abnormal findings: Secondary | ICD-10-CM

## 2012-07-04 LAB — CBC WITH DIFFERENTIAL/PLATELET
Basophils Absolute: 0 10*3/uL (ref 0.0–0.1)
Eosinophils Relative: 2.6 % (ref 0.0–5.0)
HCT: 33.3 % — ABNORMAL LOW (ref 36.0–46.0)
Lymphocytes Relative: 36.3 % (ref 12.0–46.0)
Monocytes Relative: 9 % (ref 3.0–12.0)
Neutrophils Relative %: 52 % (ref 43.0–77.0)
Platelets: 283 10*3/uL (ref 150.0–400.0)
RDW: 14 % (ref 11.5–14.6)
WBC: 3.7 10*3/uL — ABNORMAL LOW (ref 4.5–10.5)

## 2012-07-04 LAB — LIPID PANEL
Cholesterol: 152 mg/dL (ref 0–200)
VLDL: 9.8 mg/dL (ref 0.0–40.0)

## 2012-07-04 LAB — COMPREHENSIVE METABOLIC PANEL
ALT: 8 U/L (ref 0–35)
Albumin: 3.7 g/dL (ref 3.5–5.2)
CO2: 27 mEq/L (ref 19–32)
Calcium: 8.7 mg/dL (ref 8.4–10.5)
Chloride: 105 mEq/L (ref 96–112)
GFR: 118.21 mL/min (ref >60.00)
Glucose, Bld: 82 mg/dL (ref 70–99)
Potassium: 4.5 mEq/L (ref 3.5–5.1)
Sodium: 137 mEq/L (ref 135–145)
Total Bilirubin: 0.4 mg/dL (ref 0.3–1.2)
Total Protein: 6.5 g/dL (ref 6.0–8.3)

## 2012-07-04 LAB — TSH: TSH: 0.87 u[IU]/mL (ref 0.35–5.50)

## 2012-07-07 ENCOUNTER — Encounter: Payer: Self-pay | Admitting: *Deleted

## 2013-02-19 ENCOUNTER — Telehealth: Payer: Self-pay | Admitting: *Deleted

## 2013-02-19 MED ORDER — PRENATE ENHANCE 28-0.6-0.4-400 MG PO CAPS: 400.0000 mg | ORAL_CAPSULE | Freq: Every day | ORAL | Status: DC

## 2013-02-19 NOTE — Telephone Encounter (Signed)
Prenate Enhance ok #30 refill x10

## 2013-02-19 NOTE — Telephone Encounter (Signed)
Pt said the OTC with DHA is expensive and if Rx given she could just pay her copay which would be much cheaper.

## 2013-02-19 NOTE — Telephone Encounter (Signed)
She actually can buy prenatal vitamins with DHA across the counter which I think are fine

## 2013-02-19 NOTE — Telephone Encounter (Signed)
Pt informed, rx sent 

## 2013-02-19 NOTE — Telephone Encounter (Signed)
Pt called requesting a prenatal vitamin with DHA to be sent to pharmacy. Pt will be starting IVF and inferilty clinic would like to pt start this, pt was told to call GYN MD to get Rx. Please advise

## 2013-03-07 DIAGNOSIS — R87619 Unspecified abnormal cytological findings in specimens from cervix uteri: Secondary | ICD-10-CM

## 2013-03-07 HISTORY — DX: Unspecified abnormal cytological findings in specimens from cervix uteri: R87.619

## 2013-03-23 ENCOUNTER — Other Ambulatory Visit (HOSPITAL_COMMUNITY)
Admission: RE | Admit: 2013-03-23 | Discharge: 2013-03-23 | Disposition: A | Discharge status: Home / Self Care | Payer: BC Managed Care – PPO | Source: Ambulatory Visit | Attending: Gynecology | Admitting: Gynecology

## 2013-03-23 ENCOUNTER — Encounter: Payer: Self-pay | Admitting: Gynecology

## 2013-03-23 ENCOUNTER — Ambulatory Visit (INDEPENDENT_AMBULATORY_CARE_PROVIDER_SITE_OTHER): Payer: BC Managed Care – PPO | Admitting: Gynecology

## 2013-03-23 VITALS — BP 112/74 | Ht 67.0 in | Wt 147.0 lb

## 2013-03-23 DIAGNOSIS — Z01419 Encounter for gynecological examination (general) (routine) without abnormal findings: Secondary | ICD-10-CM | POA: Insufficient documentation

## 2013-03-23 DIAGNOSIS — Z1151 Encounter for screening for human papillomavirus (HPV): Secondary | ICD-10-CM | POA: Insufficient documentation

## 2013-03-23 NOTE — Patient Instructions (Signed)
Followup with Dr. Evalina Field in reference to infertility treatments.

## 2013-03-23 NOTE — Progress Notes (Signed)
Kathryn Walters 09-20-1975 [id number]        37 y.o.  G0P0 for annual exam.  About ready to start infertility cycle with Dr. Evalina Field.  Past medical history,surgical history, problem list, medications, allergies, family history and social history were all reviewed and documented in the EPIC chart.  ROS:  Performed and pertinent positives and negatives are included in the history, assessment and plan .  Exam: Kim assistant Filed Vitals:   03/23/13 1442  BP: 112/74  Height: 5\' 7"  (1.702 m)  Weight: 147 lb (66.679 kg)   General appearance  Normal Skin grossly normal Head/Neck normal with no cervical or supraclavicular adenopathy thyroid normal Lungs  clear Cardiac RR, without RMG Abdominal  soft, nontender, without masses, organomegaly or hernia Breasts  examined lying and sitting without masses, retractions, discharge or axillary adenopathy. Pelvic  Ext/BUS/vagina  normal  Cervix  normal with Pap/HPV done  Uterus  axial to retroverted, normal size, shape and contour, midline and mobile nontender   Adnexa  Without masses or tenderness    Anus and perineum  normal   Rectovaginal  normal sphincter tone without palpated masses or tenderness.    Assessment/Plan:  37 y.o. G0P0 female for annual exam, regular menses.   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 Follow up Pap smear December 2012 showed LGSIL and follow up colposcopy January 2013 showed biopsy LGSIL with negative ECC.  Pap smear 03/2012 showed ASCUS with negative high-risk HPV. Pap/HPV done today. 2. Infertility. Patient starting with infertility treatments with Dr. Evalina Field. She'll followup with him and hopefully once she achieves pregnancy will followup with an obstetrician for continuing obstetrical care. She is on a prenatal vitamin. 3. Breast health.  SBE monthly reviewed. Screening mammographic recommendations obtain 33 and 38 reviewed. Patient prefers to wait until she is 38 and hopefully do this right after pregnancy. 4. Health maintenance. No lab work done as this is reportedly done through her primary physician's office. Followup in one year, sooner as needed.   Note: This document was prepared with digital dictation and possible smart phrase technology. Any transcriptional errors that result from this process are unintentional.   Dara Lords MD, 3:04 PM 03/23/2013

## 2013-03-23 NOTE — Addendum Note (Signed)
Addended by: Dayna Barker on: 03/23/2013 03:23 PM   Modules accepted: Orders

## 2013-03-30 ENCOUNTER — Telehealth: Payer: Self-pay

## 2013-03-30 NOTE — Telephone Encounter (Signed)
Will not interfere with infertility. We do this in patients who are actively pregnant. It is important to gather information about the atypia.

## 2013-03-30 NOTE — Telephone Encounter (Signed)
Patient said she is having infertility treatment with Dr. Elesa Hacker and concerned about this recommendation.  Should she have it done at this time?

## 2013-03-30 NOTE — Telephone Encounter (Signed)
Message copied by Keenan Bachelor on Mon Mar 30, 2013 11:37 AM ------      Message from: Dara Lords      Created: Fri Mar 27, 2013  1:55 PM       Tell patient that her Pap smear showed some atypical cells she needs to schedule a colposcopy appointment. ------

## 2013-03-30 NOTE — Telephone Encounter (Signed)
Patient informed. 

## 2013-04-07 ENCOUNTER — Telehealth: Payer: Self-pay | Admitting: Gynecology

## 2013-04-07 NOTE — Telephone Encounter (Signed)
Dr. Elesa Hacker called in followup in reference to the patient's Pap smear history. They are doing a retrieval this week and he was questioning about doing transfer now or freezing and waiting until the Pap smear issue is resolved. In review of her history she has a long history of low-grade changes with positive HPV in the past. Her most recent Pap smear shows rare atypical glandular cells. She is scheduled for colposcopy. Given the possibility of her needing a cone biopsy to further evaluate the atypical glandular cells my recommendation would be to wait on transfer until we resolve this issue because if indeed she would then become pregnant a cone biopsy during the pregnancy would certainly be more complicating. Dr. Elesa Hacker agrees with this and will relay the information to the patient who he is seening today.

## 2013-04-24 ENCOUNTER — Ambulatory Visit (INDEPENDENT_AMBULATORY_CARE_PROVIDER_SITE_OTHER): Payer: BC Managed Care – PPO | Admitting: Gynecology

## 2013-04-24 ENCOUNTER — Encounter: Payer: Self-pay | Admitting: Gynecology

## 2013-04-24 DIAGNOSIS — R87610 Atypical squamous cells of undetermined significance on cytologic smear of cervix (ASC-US): Secondary | ICD-10-CM

## 2013-04-24 DIAGNOSIS — R87619 Unspecified abnormal cytological findings in specimens from cervix uteri: Secondary | ICD-10-CM

## 2013-04-24 NOTE — Addendum Note (Signed)
Addended by: Dayna Barker on: 04/24/2013 03:02 PM   Modules accepted: Orders

## 2013-04-24 NOTE — Patient Instructions (Signed)
Office will call you with biopsy results 

## 2013-04-24 NOTE — Progress Notes (Signed)
Patient ID: Kathryn Walters, female   DOB: 1976/02/03, 37 y.o.   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 Follow up Pap smear December 2012 showed LGSIL and follow up colposcopy January 2013 showed biopsy LGSIL with negative ECC.  Pap smear 03/2012 showed ASCUS with negative high-risk HPV. Pap smear 03/2013 showed rare atypical glandular cells, favor endocervical origin.  Exam with Selena Batten assistant External BUS vagina normal. Cervix grossly normal with some suggestion of extruding endocervical tissue. Uterus normal size midline mobile nontender. Adnexa without masses.  Colposcopy after acetic acid cleanse adequate showing a bulging polyp-like protrusion of endocervical tissue 11 through 2:00 anteriorly. Overlying this endocervical appearing mucosa is a patch of acetowhite change. Rep. biopsy of the acetowhite change with underlying endocervical tissue taken. ECC performed. Monsel solution applied afterwards. Physical Exam  Genitourinary:     Assessment and plan: Long history of low-grade SIL with positive high-risk HPV. Now with small cluster of atypical appearing glandular cells favor endocervical. Colposcopy is adequate with area anteriorly suggesting a polypoid endocervical area with overlying acetowhite change. Rep. biopsy taken. ECC above and involving this area taken. Patient will followup for results. I discussed the issues of atypical glandular cells and potential 2 advanced cancer. Possible scenarios to include cold knife cone follow procedure or possible sonohysterogram with endometrial sampling also discussed. Will readdress with pathology results.

## 2013-04-29 ENCOUNTER — Telehealth: Payer: Self-pay

## 2013-04-29 NOTE — Telephone Encounter (Signed)
Patient said you told her to call today if had not heard from path report.  I called pathology and pathologist is working on it now.  Tech said you should have it by end of day.  I told her we were closing at 3pm and I believe you had hoped to let patient know results today.  She said she would let pathologist know Dr. Velvet Bathe waiting for it and he may even just call you with a verbal so you can let patient know something today.

## 2013-04-29 NOTE — Telephone Encounter (Signed)
Per Dr. Velvet Bathe all benign/okay.  Patient advised that Dr. Velvet Bathe will follow up with her after holidays with plan.

## 2013-05-05 ENCOUNTER — Telehealth: Payer: Self-pay | Admitting: *Deleted

## 2013-05-05 NOTE — Telephone Encounter (Signed)
Pt scheduled for C&B on Jan 26 her question is if she should wait to continue with her infertilty treatment until after C&B? Please advise

## 2013-05-05 NOTE — Telephone Encounter (Signed)
She really needs to wait until we resolve this issue. I would suggest moving her colposcopy appointment sooner. They can schedule her into a 4:20 slot if she prefers afternoons and that way I will be able to see her to discuss options and accelerate the process.

## 2013-05-06 NOTE — Telephone Encounter (Signed)
Front desk informed with the below note.

## 2013-05-19 ENCOUNTER — Encounter: Payer: Self-pay | Admitting: Gynecology

## 2013-05-19 ENCOUNTER — Ambulatory Visit (INDEPENDENT_AMBULATORY_CARE_PROVIDER_SITE_OTHER): Payer: BC Managed Care – PPO | Admitting: Gynecology

## 2013-05-19 DIAGNOSIS — R87619 Unspecified abnormal cytological findings in specimens from cervix uteri: Secondary | ICD-10-CM

## 2013-05-19 NOTE — Patient Instructions (Signed)
Office will call you to arrange GYN oncology appointment. Office will call you with the biopsy results.

## 2013-05-19 NOTE — Progress Notes (Signed)
Patient ID: Kathryn Walters, female   DOB: July 24, 1975, 38 y.o.   MRN: [id number] Patient presents for repeat colposcopy and discussion of situation with a history of: Low-grade dysplasia with colposcopic biopsy [place]ilocytotic [place]ia in [place].  First Pap smear here July 2012 which showed ASCUS with positive high-risk HPV.  Pap smear December 2012 showed LGSIL and follow up colposcopy January 2013 showed biopsy LGSIL with negative ECC.   Pap smear 03/2012 showed ASCUS with negative high-risk HPV.  Pap smear 03/2013 showed rare atypical glandular cells, favor endocervical origin. Colposcopy 04/2013 with biopsy showed benign transformational epithelium and endocervical tissue with no atypia or dysplasia.  Patient was asked to return for repeat colposcopy to make sure I was not missing anything and discussion of current situation.  Exam was [employer] Assistant Exam external BUS vagina normal. Cervix grossly normal. Colposcopy after acetic acid cleanse is adequate with translucent acetowhite change 12:00 transformation zone. Biopsy taken. ECC performed. Physical Exam  Genitourinary:      Assessment and plan: History of low-grade squamous atypia with most recent Pap smear showing rare atypical glandular cells favor endocervical. Colposcopy unremarkable with negative biopsy/ECC. Repeat colposcopy again unremarkable with repeat sampling taken. 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 The issues of more aggressive behavior with glandular dysplasia versus squamous and issues of skip lesions discussed. Conservative standard next step would be cold knife conization with endometrial sampling. The patient is awaiting initiation of in vitro fertilization and the issue of delaying this to allow for the surgery and the recovery period in a 38 year old patient discussed. The patient initially was leaning toward doing nothing further  and going forward with in vitro fertilization delaying further evaluation until after her pregnancy. The issue of aggressiveness as well as being forced to have to deal with further evaluation during her pregnancy to include cone biopsy while pregnant was also discussed. Alternative to have gynecologic oncologist opinion and recommendations also reviewed. I also discussed if we do proceed with cone biopsy the pathology possibilities to include a realistic no pathology found in that this could be a minute focus and that we will not find it within an endocervical crypt. The patient wants to proceed with a gynecologic oncologist second opinion and we will arrange this for her.

## 2013-05-20 ENCOUNTER — Telehealth: Payer: Self-pay | Admitting: *Deleted

## 2013-05-20 NOTE — Telephone Encounter (Signed)
Message copied by Aura CampsWEBB, JENNIFER L on Wed May 20, 2013  2:28 PM ------      Message from: Keenan BachelorANNAS, KATHERINE R      Created: Wed May 20, 2013  9:10 AM                   ----- Message -----         From: Dara Lordsimothy P Fontaine, MD         Sent: 05/19/2013   5:32 PM           To: Christie NottinghamKatherine R Annas            Kathy, arrange a gynecologic oncology appointment reference atypical endocervical glandular cells. 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 My last office note outlines everything perfectly. Thanks ------

## 2013-05-20 NOTE — Telephone Encounter (Signed)
appt on 06/10/13 @ 12:15 pm with Dr.Gehrig at [facility]. Pt informed.

## 2013-06-01 ENCOUNTER — Ambulatory Visit: Payer: BC Managed Care – PPO | Admitting: Gynecology

## 2013-06-10 ENCOUNTER — Ambulatory Visit: Payer: BC Managed Care – PPO | Attending: Gynecologic Oncology | Admitting: Gynecologic Oncology

## 2013-06-10 ENCOUNTER — Ambulatory Visit: Payer: BC Managed Care – PPO

## 2013-06-10 ENCOUNTER — Other Ambulatory Visit (HOSPITAL_COMMUNITY)
Admission: RE | Admit: 2013-06-10 | Discharge: 2013-06-10 | Disposition: A | Discharge status: Home / Self Care | Payer: BC Managed Care – PPO | Source: Ambulatory Visit | Attending: Gynecologic Oncology | Admitting: Gynecologic Oncology

## 2013-06-10 ENCOUNTER — Encounter: Payer: Self-pay | Admitting: Gynecologic Oncology

## 2013-06-10 VITALS — BP 113/51 | HR 88 | Temp 97.3°F | Resp 16 | Ht 65.59 in | Wt 147.8 lb

## 2013-06-10 DIAGNOSIS — R87619 Unspecified abnormal cytological findings in specimens from cervix uteri: Secondary | ICD-10-CM | POA: Insufficient documentation

## 2013-06-10 DIAGNOSIS — Z1151 Encounter for screening for human papillomavirus (HPV): Secondary | ICD-10-CM | POA: Insufficient documentation

## 2013-06-10 DIAGNOSIS — R87612 Low grade squamous intraepithelial lesion on cytologic smear of cervix (LGSIL): Secondary | ICD-10-CM

## 2013-06-10 DIAGNOSIS — E282 Polycystic ovarian syndrome: Secondary | ICD-10-CM | POA: Insufficient documentation

## 2013-06-10 DIAGNOSIS — Z01419 Encounter for gynecological examination (general) (routine) without abnormal findings: Secondary | ICD-10-CM | POA: Insufficient documentation

## 2013-06-10 LAB — PREGNANCY, URINE: PREG TEST UR: NEGATIVE

## 2013-06-10 NOTE — Progress Notes (Signed)
Consult Note: Gyn-Onc  Kathryn Walters 38 y.o. female  CC:  Chief Complaint  Patient presents with  . Abnormal Pap Smear    HPI: Patient is seen today in consultation at the request of Dr. Audie Box.  Patient is a 38 year old gravida 0 who has a long history of cervical dysplasia. Her Pap smear with Dr. Audie Box was first abnormal in July 2012 at which time it showed atypical squamous cells of undetermined significance with positive high-risk HPV. Pap smear in December 2004 revealed low-grade dysplasia. Colposcopy in June of 2013 should biopsy proven low-grade dysplasia with a negative endocervical curettage. Pap smear in November 2013 revealed atypical squamous cells of undetermined significance with negative high risk HPV. As this HPV now resolved she was followed appropriately in 1 year. Pap smear November 2013 showed rare atypical glandular cells favoring endocervical origin. She underwent colposcopy in December 2013 that showed a benign transformational epithelium and the endocervical curettage revealed no atypia or dysplasia. She had repeat colposcopy 05/19/2013. Similarly the colposcopy was adequate. It was negative for any evidence of significant dysplasia both on the ectocervical and endocervical curettage.  Diagnosis 1. Cervix, biopsy, 12:00 - TRANSFORMATION ZONE MUCOSA WITH LOW GRADE SQUAMOUS INTRAEPITHELIAL LESION,CIN-I (MILD DYSPLASIA). - ACUTE AND CHRONIC CERVICITIS PRESENT. 2. Endocervix, curettage - DETACHED BENIGN ENDOCERVICAL MUCOSA AND EXTREMELY SCANT BENIGN SQUAMOUSMUCOSA ADMIXED WITH MUCIN. - NO DYSPLASIA, ATYPIA OR MALIGNANCY IDENTIFIED.  Her cycles are regular. Her last cycle was December 22. She's been on continuous OCPs. She's never had any irregular cycles. They've always been approximately 29-30 days. She has a history of polycystic ovarian syndrome. She believes that she very well with that on her menstrual cycle or just getting ready to start her cycle at the time of  that Pap smear. She has undergone IVF is harvested 80 site and is interested in attempting conception as soon as possible. She denies any postcoital bleeding or any intermenstrual spotting.  We have reviewed the algorithm for following Pap smears that showed atypical glandular cells of undetermined significance. We discussed the algorithm for both those women younger than 75 and those older than 40.  In the algorithm in the patients younger than 52 with a negative colposcopy and endocervical curettage a repeat followup in 3 months would not be unreasonable. We also discussed the role of endometrial biopsy. At this time, as it has been 3 months since her prior Pap smear we discussed proceeding with a Pap smear and HPV testing today as well as an endometrial biopsy. She is amenable to this. Your pregnancy test was obtained and was negative.    Current Meds:  Outpatient Encounter Prescriptions as of 06/10/2013  Medication Sig  . desogestrel-ethinyl estradiol (MIRCETTE) 0.15-0.02/0.01 MG (21/5) tablet Take 1 tablet by mouth daily.  Burnis Medin w/o A-FE-Methfol-FA-DHA (PRENATE ENHANCE) 28-0.6-0.4-400 MG CAPS Take 400 mg by mouth daily.    Allergy:  Allergies  Allergen Reactions  . Aspirin Swelling  . Ibuprofen Swelling    Social Hx:   History   Social History  . Marital Status: Married    Spouse Name: N/A    Number of Children: 0  . Years of Education: N/A   Occupational History  . clinical trial manager    Social History Main Topics  . Smoking status: Never Smoker   . Smokeless tobacco: Never Used  . Alcohol Use: 1.2 oz/week    2 Glasses of wine per week  . Drug Use: No  . Sexual Activity: Yes    Birth  Control/ Protection: Pill   Other Topics Concern  . Not on file   Social History Narrative   [place] from [place], used to live in [place], moved to [place] ~2010, she is a MD in [place] ---   Diet: healthy most of the time ---   Exercise: no    Past Surgical Hx:  Past Surgical  History  Procedure Laterality Date  . Tonsillectomy  1982  . Foot surgery  1993    left  . Colposcopy  05/2011  . Mass excision  10/25/2011    Procedure: EXCISION MASS;  Surgeon: Shelly Rubensteinouglas A Blackman, MD;  Location: [facility];  Service: General;  Laterality: N/A;  Excision Right Groin Mass,     Past Medical Hx:  Past Medical History  Diagnosis Date  . Wears glasses   . Hernia   . ASCUS (atypical squamous cells of undetermined significance) on Pap smear 10/2010  . 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 Idiopathic urticaria   . LGSIL (low grade squamous intraepithelial dysplasia) 05/2011    colposcopy biopsy  . Infertility, female     Oncology Hx:   No history exists.    Family Hx:  Family History  Problem Relation Age of Onset  . Hypertension Mother   . CAD Neg Hx   . Stroke Neg Hx   . Colon cancer Neg Hx   . Breast cancer Neg Hx   . Diabetes Maternal Grandfather     Vitals:  Blood pressure 113/51, pulse 88, temperature 97.3 F (36.3 C), temperature source Oral, resp. rate 16, height 5' 5.59" (1.666 m), weight 147 lb 12.8 oz (67.042 kg), last menstrual period 04/27/2013, SpO2 100.00%.  Physical Exam: Well-nourished well-developed female in no acute distress.  Pelvic: Normal external female genitalia. Vagina is well epithelialized. The cervix is nulliparous. There's no visible lesions. ThinPrep Pap smear was submitted without difficulty. The cervix was as well Betadine cleaning solution. The endometrial Pipelle was passed without difficulty. The uterus sounded to 8 cm. 2 passes were obtained with mucus type material and particulate tissue. Bimanual examination: the cervix is palpably normal. The uterus is of normal size shape and consistency. There no adnexal masses.  Assessment/Plan:  38 year old with a Pap smear in November of 2014 revealed atypical glandular cells of undetermined significance. She's had 2 colposcopies which is been adequate and 2 negative endocervical  curettings. One biopsy revealed low-grade dysplasia on the ectocervix. We will followup in results for Pap smear from today as well as an endometrial biopsy. I believe if the Pap smear is negative with negative high risk HPV and endometrial biopsies negative she did have one additional Pap smear in 3 months. If that Pap smear similarly unremarkable she could proceed with in vitro fertilization. However, if the Pap smear today is abnormal or there's any abnormality on the endometrial biopsy, she understands that she may need to defer her fertility planning and we may need to consider another excisional procedures pending today's results.   Justice Milliron A., MD 06/10/2013, 11:23 AM

## 2013-06-10 NOTE — Patient Instructions (Signed)
Endometrial Biopsy, Care After Refer to this sheet in the next few weeks. These instructions provide you with information on caring for yourself after your procedure. Your health care provider may also give you more specific instructions. Your treatment has been planned according to current medical practices, but problems sometimes occur. Call your health care provider if you have any problems or questions after your procedure. WHAT TO EXPECT AFTER THE PROCEDURE After your procedure, it is typical to have the following:  You may have mild cramping and a small amount of vaginal bleeding for a few days after the procedure. This is normal. HOME CARE INSTRUCTIONS  Only take over-the-counter or prescription medicine as directed by your health care provider.  Do not douche, use tampons, or have sexual intercourse until your health care provider approves.  Follow your health care provider's instructions regarding any activity restrictions, such as strenuous exercise or heavy lifting. SEEK MEDICAL CARE IF:  You have heavy bleeding or bleeding longer than 2 days after the procedure.  You have bad smelling drainage from your vagina.  You have a fever and chills.  Youhave severe lower stomach (abdominal) pain. SEEK IMMEDIATE MEDICAL CARE IF:  You have severe cramps in your stomach or back.  You pass large blood clots.  Your bleeding increases.  You become weak or lightheaded, or you pass out. Document Released: 02/11/2013 Document Reviewed: 10/08/2012 ExitCare Patient Information 2014 [employer].  

## 2013-06-10 NOTE — Addendum Note (Signed)
Addended by: Warner MccreedyROSS, Tersa Fotopoulos D on: 06/10/2013 02:54 PM   Modules accepted: Orders

## 2013-06-12 ENCOUNTER — Telehealth: Payer: Self-pay | Admitting: *Deleted

## 2013-06-12 NOTE — Telephone Encounter (Signed)
Message copied by Cooper RenderGARNER, MARY P on Fri Jun 12, 2013  3:18 PM ------      Message from: Warner MccreedyROSS, MELISSA D      Created: Fri Jun 12, 2013  3:17 PM       She needs to have repeat pap, colpo and ECC in three months which can be done here or at her referring physicians office.  Thanks!       ----- Message -----         From: [facility]         Sent: 06/11/2013   4:16 PM           To: Doylene BodeMelissa D Cross, NP                   ------

## 2013-06-12 NOTE — Telephone Encounter (Signed)
Call to pt gave results.Records  faxed to Dr. Morrison OldJeffrey Deaton at [facility] per pt request.

## 2013-08-11 ENCOUNTER — Telehealth: Payer: Self-pay | Admitting: *Deleted

## 2013-08-11 NOTE — Telephone Encounter (Signed)
Per Dr.Gerhrig note on 06/12/13 "She needs to have repeat pap, colpo and ECC in three months which can be done here or at her referring physicians office" pt said she would like to have this done here. Please advise

## 2013-08-11 NOTE — Telephone Encounter (Signed)
Ok to arrange for here as patient wishes

## 2013-08-11 NOTE — Telephone Encounter (Signed)
Patient informed that Dr. Velvet BatheF is fine with her doing this here. She will expect a call today from our appt desk.

## 2013-08-11 NOTE — Telephone Encounter (Signed)
Left message to call.

## 2013-09-16 ENCOUNTER — Other Ambulatory Visit (HOSPITAL_COMMUNITY)
Admission: RE | Admit: 2013-09-16 | Discharge: 2013-09-16 | Disposition: A | Discharge status: Home / Self Care | Payer: BC Managed Care – PPO | Source: Ambulatory Visit | Attending: Gynecology | Admitting: Gynecology

## 2013-09-16 ENCOUNTER — Encounter: Payer: Self-pay | Admitting: Gynecology

## 2013-09-16 ENCOUNTER — Ambulatory Visit (INDEPENDENT_AMBULATORY_CARE_PROVIDER_SITE_OTHER): Payer: BC Managed Care – PPO | Admitting: Gynecology

## 2013-09-16 DIAGNOSIS — IMO0002 Reserved for concepts with insufficient information to code with codable children: Secondary | ICD-10-CM

## 2013-09-16 DIAGNOSIS — Z01419 Encounter for gynecological examination (general) (routine) without abnormal findings: Secondary | ICD-10-CM | POA: Insufficient documentation

## 2013-09-16 DIAGNOSIS — Z1151 Encounter for screening for human papillomavirus (HPV): Secondary | ICD-10-CM | POA: Insufficient documentation

## 2013-09-16 DIAGNOSIS — R6889 Other general symptoms and signs: Secondary | ICD-10-CM

## 2013-09-16 DIAGNOSIS — R87610 Atypical squamous cells of undetermined significance on cytologic smear of cervix (ASC-US): Secondary | ICD-10-CM

## 2013-09-16 DIAGNOSIS — R87619 Unspecified abnormal cytological findings in specimens from cervix uteri: Secondary | ICD-10-CM

## 2013-09-16 NOTE — Patient Instructions (Signed)
Office will contact you with the biopsy results and Pap smear results.

## 2013-09-16 NOTE — Addendum Note (Signed)
Addended by: Dayna BarkerGARDNER, KIMBERLY K on: 09/16/2013 12:49 PM   Modules accepted: Orders

## 2013-09-16 NOTE — Progress Notes (Signed)
Patient ID: Kathryn Walters, female   DOB: [birth year], 38 y.o.   MRN: [id number] Kathryn Walters [birth year] [id number]        38 y.o.  G0P0 presents for colposcopy with a history of colon Low-grade dysplasia with colposcopic biopsy showing koilocytotic atypia in [place].  First Pap smear here July 2012 which showed ASCUS with positive high-risk HPV.  Pap smear December 2012 showed LGSIL and follow up colposcopy January 2013 showed biopsy LGSIL with negative ECC.  Pap smear 03/2012 showed ASCUS with negative high-risk HPV.  Pap smear 03/2013 showed rare atypical glandular cells, favor endocervical origin.  Colposcopy 04/2013 with biopsy showed benign transformational epithelium and endocervical tissue with no atypia or dysplasia. Colposcopy 05/2013 with area of acetowhite change 12:00 transformation zone. Biopsy showed LGSIL, negative ECC. Second opinion consult with Dr. Loel DubonnetPaalo Gehrig 06/2013 showed negative Pap smear with negative HPV, negative endometrial biopsy and negative endocervical tissue. Patient presents now for followup colposcopy.  Past medical history,surgical history, problem list, medications, allergies, family history and social history were all reviewed and documented in the EPIC chart.  Directed ROS with pertinent positives and negatives documented in the history of present illness/assessment and plan.  Exam: Kim assistant General appearance  Normal Pelvic external BUS vagina normal. Cervix normal. Uterus normal size midline mobile nontender. Adnexa without masses or tenderness.  Colposcopy after acetic acid cleanse adequate and normal. Pap smear and ECC performed Physical Exam  Genitourinary:       Assessment/Plan:  38 y.o. G0P0 with history as above. Followup for Pap smear and ECC results. Assuming negative then recommend followup Pap smear in 6 months. Patient awaiting to start in vitro fertilization. Issues of waiting through 6 month window versus attempting now recognizing  if recurrence of her atypia the possible need for intervention during the pregnancy to include cone biopsy reviewed. Will follow up for Pap smear and ECC results and then we'll go from there.   Note: This document was prepared with digital dictation and possible smart phrase technology. Any transcriptional errors that result from this process are unintentional.   Dara Lordsimothy P Fontaine MD, 12:30 PM 09/16/2013

## 2013-12-09 LAB — OB RESULTS CONSOLE ABO/RH: RH TYPE: POSITIVE

## 2013-12-09 LAB — OB RESULTS CONSOLE RUBELLA ANTIBODY, IGM: Rubella: IMMUNE

## 2013-12-09 LAB — OB RESULTS CONSOLE ANTIBODY SCREEN: Antibody Screen: NEGATIVE

## 2013-12-09 LAB — OB RESULTS CONSOLE HEPATITIS B SURFACE ANTIGEN: Hepatitis B Surface Ag: NEGATIVE

## 2013-12-09 LAB — OB RESULTS CONSOLE HIV ANTIBODY (ROUTINE TESTING): HIV: NONREACTIVE

## 2013-12-09 LAB — OB RESULTS CONSOLE RPR: RPR: NONREACTIVE

## 2014-05-07 NOTE — L&D Delivery Note (Signed)
Patient was C/C/+1 and pushed for 3 hours minutes with epidural (first hour was mostly getting comfortable with epidural).    Pt pushed to +3 to +4 station OA and was offered Vacuum assistance.  All R/B/Alt d/w pt and she agreed to proceed. Vacuum placed alternating with pt pushing for 4 times- first two were unable to get sufficient seal.  Only one popoff.    Fourth placement effected VAVD of female infant, Apgars 8,9, weight P.   The patient had a partial third degree median laceration with bilateral sulcal tears- all repaired with2-0 Vicryl R.  A small L labial tear was repaired with 3-0 vicryl R. Fundus was firm. EBL was expected amount. Placenta was delivered manually in pieces and final uterine exploration was clear of debris. Vagina was clear.  Baby was vigorous and doing skin to skin with mother.  Faige Seely A

## 2014-05-25 ENCOUNTER — Inpatient Hospital Stay (HOSPITAL_COMMUNITY): Admission: AD | Admit: 2014-05-25 | Payer: Self-pay | Source: Ambulatory Visit | Admitting: Obstetrics and Gynecology

## 2014-06-01 ENCOUNTER — Other Ambulatory Visit: Payer: Self-pay | Admitting: Obstetrics and Gynecology

## 2014-06-01 LAB — OB RESULTS CONSOLE GBS: STREP GROUP B AG: NEGATIVE

## 2014-06-21 ENCOUNTER — Telehealth (HOSPITAL_COMMUNITY): Payer: Self-pay | Admitting: *Deleted

## 2014-06-21 ENCOUNTER — Encounter (HOSPITAL_COMMUNITY): Payer: Self-pay | Admitting: *Deleted

## 2014-06-21 ENCOUNTER — Inpatient Hospital Stay (HOSPITAL_COMMUNITY)
Admission: RE | Admit: 2014-06-21 | Discharge: 2014-06-25 | DRG: 988 | Disposition: A | Discharge status: Home / Self Care | Payer: BLUE CROSS/BLUE SHIELD | Source: Ambulatory Visit | Attending: Obstetrics and Gynecology | Admitting: Obstetrics and Gynecology

## 2014-06-21 DIAGNOSIS — Z23 Encounter for immunization: Secondary | ICD-10-CM

## 2014-06-21 DIAGNOSIS — A630 Anogenital (venereal) warts: Secondary | ICD-10-CM | POA: Diagnosis present

## 2014-06-21 DIAGNOSIS — O09813 Supervision of pregnancy resulting from assisted reproductive technology, third trimester: Secondary | ICD-10-CM | POA: Diagnosis not present

## 2014-06-21 DIAGNOSIS — O09513 Supervision of elderly primigravida, third trimester: Secondary | ICD-10-CM | POA: Diagnosis present

## 2014-06-21 DIAGNOSIS — O09519 Supervision of elderly primigravida, unspecified trimester: Secondary | ICD-10-CM

## 2014-06-21 DIAGNOSIS — Z3A39 39 weeks gestation of pregnancy: Secondary | ICD-10-CM | POA: Diagnosis present

## 2014-06-21 DIAGNOSIS — O9832 Other infections with a predominantly sexual mode of transmission complicating childbirth: Secondary | ICD-10-CM | POA: Diagnosis present

## 2014-06-21 LAB — CBC
HCT: 36.1 % (ref 36.0–46.0)
Hemoglobin: 12.3 g/dL (ref 12.0–15.0)
MCH: 33.1 pg (ref 26.0–34.0)
MCHC: 34.1 g/dL (ref 30.0–36.0)
MCV: 97 fL (ref 78.0–100.0)
Platelets: 291 10*3/uL (ref 150–400)
RBC: 3.72 MIL/uL — AB (ref 3.87–5.11)
RDW: 14.5 % (ref 11.5–15.5)
WBC: 6.7 10*3/uL (ref 4.0–10.5)

## 2014-06-21 LAB — TYPE AND SCREEN
ABO/RH(D): O POS
ANTIBODY SCREEN: NEGATIVE

## 2014-06-21 MED ORDER — CITRIC ACID-SODIUM CITRATE 334-500 MG/5ML PO SOLN: 30.0000 mL | ORAL | Status: DC | PRN

## 2014-06-21 MED ORDER — MISOPROSTOL 25 MCG QUARTER TABLET
25.0000 ug | ORAL_TABLET | ORAL | Status: DC | PRN
  Administered 2014-06-21: 25 ug via VAGINAL
  Filled 2014-06-21: qty 1
  Filled 2014-06-21: qty 0.25

## 2014-06-21 MED ORDER — OXYTOCIN BOLUS FROM INFUSION
500.0000 mL | INTRAVENOUS | Status: DC
  Administered 2014-06-23: 500 mL via INTRAVENOUS

## 2014-06-21 MED ORDER — LIDOCAINE HCL (PF) 1 % IJ SOLN
30.0000 mL | INTRAMUSCULAR | Status: AC | PRN
  Administered 2014-06-23: 30 mL via SUBCUTANEOUS
  Filled 2014-06-21: qty 30

## 2014-06-21 MED ORDER — OXYCODONE-ACETAMINOPHEN 5-325 MG PO TABS: 2.0000 | ORAL_TABLET | ORAL | Status: DC | PRN

## 2014-06-21 MED ORDER — BUTORPHANOL TARTRATE 1 MG/ML IJ SOLN: 1.0000 mg | INTRAMUSCULAR | Status: DC | PRN

## 2014-06-21 MED ORDER — LACTATED RINGERS IV SOLN: 500.0000 mL | INTRAVENOUS | Status: DC | PRN

## 2014-06-21 MED ORDER — LACTATED RINGERS IV SOLN
INTRAVENOUS | Status: DC
  Administered 2014-06-21 – 2014-06-23 (×5): via INTRAVENOUS

## 2014-06-21 MED ORDER — ACETAMINOPHEN 325 MG PO TABS: 650.0000 mg | ORAL_TABLET | ORAL | Status: DC | PRN

## 2014-06-21 MED ORDER — ONDANSETRON HCL 4 MG/2ML IJ SOLN: 4.0000 mg | Freq: Four times a day (QID) | INTRAMUSCULAR | Status: DC | PRN

## 2014-06-21 MED ORDER — ZOLPIDEM TARTRATE 5 MG PO TABS
5.0000 mg | ORAL_TABLET | Freq: Every evening | ORAL | Status: DC | PRN
  Filled 2014-06-21: qty 1

## 2014-06-21 MED ORDER — OXYCODONE-ACETAMINOPHEN 5-325 MG PO TABS: 1.0000 | ORAL_TABLET | ORAL | Status: DC | PRN

## 2014-06-21 MED ORDER — OXYTOCIN 40 UNITS IN LACTATED RINGERS INFUSION - SIMPLE MED: 62.5000 mL/h | INTRAVENOUS | Status: DC

## 2014-06-21 MED ORDER — TERBUTALINE SULFATE 1 MG/ML IJ SOLN: 0.2500 mg | Freq: Once | INTRAMUSCULAR | Status: AC | PRN

## 2014-06-21 NOTE — Telephone Encounter (Signed)
Preadmission screen  

## 2014-06-21 NOTE — H&P (Signed)
Kathryn Walters is a 39 y.o. female presenting for induction of labor at term due to advanced maternal age>40. MFM was consulted and this course of action was advised.  Patient with rare ctx, no leaking of fluid, no vaginal bleeding notes good FM.   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 Induction  Contractions: Onset was less than 1 hour ago.   Frequency: rare.   Duration is approximately 40 seconds.   Perceived severity is mild.    Fetal activity: Perceived fetal activity is normal.   Last perceived fetal movement was within the past 12 hours.    Prenatal complications: no prenatal complications Prenatal Complications - Diabetes: none.    OB History    Gravida Para Term Preterm AB TAB SAB Ectopic Multiple Living   1 0             Past Medical History  Diagnosis Date  . Wears glasses   . Hernia   . ASCUS (atypical squamous cells of undetermined significance) on Pap smear 10/2010  . 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 Idiopathic urticaria   . LGSIL (low grade squamous intraepithelial dysplasia) 05/2011    colposcopy biopsy  . Infertility, female   . Atypical glandular cells of undetermined significance of cervix 03/2013  . Fragile X syndrome    Past Surgical History  Procedure Laterality Date  . Tonsillectomy  1982  . Foot surgery  1993    left  . Colposcopy  05/2011  . Mass excision  10/25/2011    Procedure: EXCISION MASS;  Surgeon: Shelly Rubenstein, MD;  Location: [facility];  Service: General;  Laterality: N/A;  Excision Right Groin Mass,   . Endometrial biopsy     Family History: family history includes Diabetes in her maternal grandfather; Hypertension in her mother. There is no history of CAD, Stroke, Colon cancer, or Breast cancer. Social History:  reports that she has never smoked. She has never used smokeless tobacco. She reports that she drinks  about 1.2 oz of alcohol per week. She reports that she does not use illicit drugs.   Prenatal Transfer Tool  Maternal Diabetes: No Genetic Screening: Normal Maternal Ultrasounds/Referrals: Normal Fetal Ultrasounds or other Referrals:  Referred to Materal Fetal Medicine  Maternal Substance Abuse:  No Significant Maternal Medications:  None Significant Maternal Lab Results:  Lab values include: Group B Strep negative Other Comments:  None  Review of Systems  Constitutional: Negative for fever and chills.  Eyes: Negative for blurred vision and double vision.  Respiratory: Negative for cough and hemoptysis.   Cardiovascular: Negative for chest pain and palpitations.  Gastrointestinal: Negative for heartburn and nausea.  Genitourinary: Negative for dysuria and urgency.  Musculoskeletal: Negative for myalgias and neck pain.  Skin: Negative for itching and rash.  Neurological: Negative for dizziness and headaches.  Psychiatric/Behavioral: Negative for depression and suicidal ideas.  All other systems reviewed and are negative.   Dilation: Fingertip Effacement (%): 50 Station: -1, 0 Exam by:: Lucas Mallow, RN  Blood pressure 134/76, pulse 97, temperature 98.4 F (36.9 C), temperature source Oral, resp. rate 18, height  (1.702 m), weight 78.019 kg (172 lb), last menstrual period 09/17/2013. Maternal Exam:  Uterine Assessment: Contraction strength is mild.  Contraction frequency is rare.   Abdomen: Patient reports no abdominal tenderness. Fundal height is 39 cm.   Estimated fetal weight is 2800 grams.  Fetal presentation: vertex  Introitus: Normal vulva. Normal vagina.  Ferning test: not done.  Nitrazine test: not done.  Pelvis: adequate for delivery.   Cervix: Cervix evaluated by digital exam.   Finger tip / thick  Fetal Exam Fetal Monitor Review: Mode: hand-held doppler probe.   Baseline rate: 140.  Variability: moderate (6-25 bpm).   Pattern: accelerations present and no  decelerations.    Fetal State Assessment: Category I - tracings are normal.     Physical Exam  Vitals reviewed. Constitutional: She is oriented to person, place, and time. She appears well-developed and well-nourished.  HENT:  Head: Normocephalic and atraumatic.  Eyes: Pupils are equal, round, and reactive to light.  Cardiovascular: Normal rate and regular rhythm.   Respiratory: Effort normal.  GI: Soft.  Genitourinary: Vagina normal.  Musculoskeletal: Normal range of motion.  Neurological: She is alert and oriented to person, place, and time.  Skin: Skin is warm.    Prenatal labs: ABO, Rh: O/Positive/-- (08/05 0000) Antibody: Negative (08/05 0000) Rubella: Immune (08/05 0000) RPR: Nonreactive (08/05 0000)  HBsAg: Negative (08/05 0000)  HIV: Non-reactive (08/05 0000)  GBS: Negative (01/26 0000)   Assessment/Plan: 39 yo G1P0 at 39 weeks 4 days for elective IOL Patient was previously counseled about risk of IUFD for with AMA >age 21. MFM aware and agree with proceeding with induction.  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     [name], [name] 06/21/2014, 9:03 PM

## 2014-06-22 ENCOUNTER — Inpatient Hospital Stay (HOSPITAL_COMMUNITY): Payer: BLUE CROSS/BLUE SHIELD | Admitting: Anesthesiology

## 2014-06-22 ENCOUNTER — Encounter (HOSPITAL_COMMUNITY): Payer: Self-pay

## 2014-06-22 LAB — ABO/RH: ABO/RH(D): O POS

## 2014-06-22 MED ORDER — FENTANYL 2.5 MCG/ML BUPIVACAINE 1/10 % EPIDURAL INFUSION (WH - ANES)
14.0000 mL/h | INTRAMUSCULAR | Status: DC | PRN
  Administered 2014-06-22 – 2014-06-23 (×6): 14 mL/h via EPIDURAL
  Filled 2014-06-22 (×6): qty 125

## 2014-06-22 MED ORDER — EPHEDRINE 5 MG/ML INJ
10.0000 mg | INTRAVENOUS | Status: DC | PRN
  Filled 2014-06-22: qty 4
  Filled 2014-06-22: qty 2

## 2014-06-22 MED ORDER — LACTATED RINGERS IV SOLN
500.0000 mL | Freq: Once | INTRAVENOUS | Status: AC
  Administered 2014-06-22: 500 mL via INTRAVENOUS

## 2014-06-22 MED ORDER — LIDOCAINE HCL (PF) 1 % IJ SOLN
INTRAMUSCULAR | Status: DC | PRN
  Administered 2014-06-22 (×2): 4 mL

## 2014-06-22 MED ORDER — PHENYLEPHRINE 40 MCG/ML (10ML) SYRINGE FOR IV PUSH (FOR BLOOD PRESSURE SUPPORT)
80.0000 ug | PREFILLED_SYRINGE | INTRAVENOUS | Status: DC | PRN
  Filled 2014-06-22: qty 2

## 2014-06-22 MED ORDER — OXYTOCIN 40 UNITS IN LACTATED RINGERS INFUSION - SIMPLE MED
1.0000 m[IU]/min | INTRAVENOUS | Status: DC
  Administered 2014-06-22: 1 m[IU]/min via INTRAVENOUS
  Filled 2014-06-22: qty 1000

## 2014-06-22 MED ORDER — EPHEDRINE 5 MG/ML INJ
10.0000 mg | INTRAVENOUS | Status: DC | PRN
  Filled 2014-06-22: qty 2

## 2014-06-22 MED ORDER — DIPHENHYDRAMINE HCL 50 MG/ML IJ SOLN: 12.5000 mg | INTRAMUSCULAR | Status: DC | PRN

## 2014-06-22 MED ORDER — TERBUTALINE SULFATE 1 MG/ML IJ SOLN: 0.2500 mg | Freq: Once | INTRAMUSCULAR | Status: AC | PRN

## 2014-06-22 MED ORDER — FENTANYL 2.5 MCG/ML BUPIVACAINE 1/10 % EPIDURAL INFUSION (WH - ANES)
INTRAMUSCULAR | Status: DC | PRN
  Administered 2014-06-22: 14 mL/h via EPIDURAL

## 2014-06-22 MED ORDER — PHENYLEPHRINE 40 MCG/ML (10ML) SYRINGE FOR IV PUSH (FOR BLOOD PRESSURE SUPPORT)
80.0000 ug | PREFILLED_SYRINGE | INTRAVENOUS | Status: DC | PRN
  Filled 2014-06-22: qty 20
  Filled 2014-06-22: qty 2

## 2014-06-22 NOTE — Progress Notes (Signed)
Dr Lucky RathkePinn Called and notified of patient's SVE and contraction pattern, instructed to start pitocin when contractions space out per protocol

## 2014-06-22 NOTE — Progress Notes (Signed)
Patient ID: Kathryn Walters, female   DOB: [birth year], 39 y.o.   MRN: [id number]  S: Pt comfortable, feeling some contractions O: AFVSS Cvx 1-2/70/-2 toco irregular FHR 140s reactive  Foley catheter placed through cervix to augmentation. Balloon inflated. Pt now with more pain with contractions. Considering epidural placement  A/P  1) FWB reassuring 2) Proceed with Epidural 3) Low dose pit while foley in place 4) AROM once foley out

## 2014-06-22 NOTE — Anesthesia Preprocedure Evaluation (Signed)
Anesthesia Evaluation  Patient identified by MRN, date of birth, ID band Patient awake    Reviewed: Allergy & Precautions, H&P , Patient's Chart, lab work & pertinent test results  Airway Mallampati: II  TM Distance: >3 FB Neck ROM: full    Dental no notable dental hx. (+) Teeth Intact   Pulmonary neg pulmonary ROS,  breath sounds clear to auscultation  Pulmonary exam normal       Cardiovascular negative cardio ROS  Rhythm:regular Rate:Normal     Neuro/Psych negative neurological ROS  negative psych ROS   GI/Hepatic negative GI ROS, Neg liver ROS,   Endo/Other  negative endocrine ROS  Renal/GU negative Renal ROS  negative genitourinary   Musculoskeletal   Abdominal Normal abdominal exam  (+)   Peds  Hematology negative hematology ROS (+)   Anesthesia Other Findings   Reproductive/Obstetrics (+) Pregnancy HPV                             Anesthesia Physical Anesthesia Plan  ASA: II  Anesthesia Plan: Epidural   Post-op Pain Management:    Induction:   Airway Management Planned:   Additional Equipment:   Intra-op Plan:   Post-operative Plan:   Informed Consent: I have reviewed the patients History and Physical, chart, labs and discussed the procedure including the risks, benefits and alternatives for the proposed anesthesia with the patient or authorized representative who has indicated his/her understanding and acceptance.     Plan Discussed with: Anesthesiologist  Anesthesia Plan Comments:         Anesthesia Quick Evaluation

## 2014-06-22 NOTE — Anesthesia Procedure Notes (Addendum)
Epidural Patient location during procedure: OB Start time: 06/22/2014 9:27 AM  Staffing Anesthesiologist: Villa Burgin A. Performed by: anesthesiologist   Preanesthetic Checklist Completed: patient identified, site marked, surgical consent, pre-op evaluation, timeout performed, IV checked, risks and benefits discussed and monitors and equipment checked  Epidural Patient position: sitting Prep: site prepped and draped and DuraPrep Patient monitoring: continuous pulse ox and blood pressure Approach: midline Location: L3-L4 Injection technique: LOR air  Needle:  Needle type: Tuohy  Needle gauge: 17 G Needle length: 9 cm and 9 Needle insertion depth: 5 cm cm Catheter type: closed end flexible Catheter size: 19 Gauge Catheter at skin depth: 10 cm Test dose: negative and Other  Assessment Events: blood not aspirated, injection not painful, no injection resistance, negative IV test and no paresthesia  Additional Notes Patient identified. Risks and benefits discussed including failed block, incomplete  Pain control, post dural puncture headache, nerve damage, paralysis, blood pressure Changes, nausea, vomiting, reactions to medications-both toxic and allergic and post Partum back pain. All questions were answered. Patient expressed understanding and wished to proceed. Sterile technique was used throughout procedure. Epidural site was Dressed with sterile barrier dressing. No paresthesias, signs of intravascular injection Or signs of intrathecal spread were encountered.  Patient was more comfortable after the epidural was dosed. Please see RN's note for documentation of vital signs and FHR which are stable.

## 2014-06-23 ENCOUNTER — Inpatient Hospital Stay (HOSPITAL_COMMUNITY): Payer: BLUE CROSS/BLUE SHIELD

## 2014-06-23 ENCOUNTER — Encounter (HOSPITAL_COMMUNITY): Payer: Self-pay

## 2014-06-23 LAB — RPR: RPR: NONREACTIVE

## 2014-06-23 MED ORDER — OXYCODONE-ACETAMINOPHEN 5-325 MG PO TABS: 2.0000 | ORAL_TABLET | ORAL | Status: DC | PRN

## 2014-06-23 MED ORDER — MAGNESIUM HYDROXIDE 400 MG/5ML PO SUSP: 30.0000 mL | ORAL | Status: DC | PRN

## 2014-06-23 MED ORDER — SODIUM CHLORIDE 0.9 % IJ SOLN: 3.0000 mL | INTRAMUSCULAR | Status: DC | PRN

## 2014-06-23 MED ORDER — TETANUS-DIPHTH-ACELL PERTUSSIS 5-2.5-18.5 LF-MCG/0.5 IM SUSP
0.5000 mL | Freq: Once | INTRAMUSCULAR | Status: AC
  Administered 2014-06-23: 0.5 mL via INTRAMUSCULAR
  Filled 2014-06-23: qty 0.5

## 2014-06-23 MED ORDER — MEASLES, MUMPS & RUBELLA VAC ~~LOC~~ INJ
0.5000 mL | INJECTION | Freq: Once | SUBCUTANEOUS | Status: DC
  Filled 2014-06-23: qty 0.5

## 2014-06-23 MED ORDER — FERROUS SULFATE 325 (65 FE) MG PO TABS
325.0000 mg | ORAL_TABLET | Freq: Two times a day (BID) | ORAL | Status: DC
  Filled 2014-06-23 (×2): qty 1

## 2014-06-23 MED ORDER — SODIUM CHLORIDE 0.9 % IV SOLN: 250.0000 mL | INTRAVENOUS | Status: DC | PRN

## 2014-06-23 MED ORDER — LANOLIN HYDROUS EX OINT: TOPICAL_OINTMENT | CUTANEOUS | Status: DC | PRN

## 2014-06-23 MED ORDER — SENNOSIDES-DOCUSATE SODIUM 8.6-50 MG PO TABS
2.0000 | ORAL_TABLET | ORAL | Status: DC
  Administered 2014-06-23 – 2014-06-24 (×2): 2 via ORAL
  Filled 2014-06-23 (×2): qty 2

## 2014-06-23 MED ORDER — BENZOCAINE-MENTHOL 20-0.5 % EX AERO
1.0000 "application " | INHALATION_SPRAY | CUTANEOUS | Status: DC | PRN
  Administered 2014-06-23: 1 via TOPICAL
  Filled 2014-06-23: qty 56

## 2014-06-23 MED ORDER — ZOLPIDEM TARTRATE 5 MG PO TABS: 5.0000 mg | ORAL_TABLET | Freq: Every evening | ORAL | Status: DC | PRN

## 2014-06-23 MED ORDER — OXYCODONE-ACETAMINOPHEN 5-325 MG PO TABS
1.0000 | ORAL_TABLET | ORAL | Status: DC | PRN
  Administered 2014-06-23: 1 via ORAL
  Filled 2014-06-23: qty 1

## 2014-06-23 MED ORDER — SODIUM CHLORIDE 0.9 % IJ SOLN
3.0000 mL | Freq: Two times a day (BID) | INTRAMUSCULAR | Status: DC
  Administered 2014-06-23: 3 mL via INTRAVENOUS

## 2014-06-23 MED ORDER — ONDANSETRON HCL 4 MG PO TABS: 4.0000 mg | ORAL_TABLET | ORAL | Status: DC | PRN

## 2014-06-23 MED ORDER — PRENATAL MULTIVITAMIN CH
1.0000 | ORAL_TABLET | Freq: Every day | ORAL | Status: DC
  Filled 2014-06-23: qty 1

## 2014-06-23 MED ORDER — ACETAMINOPHEN 500 MG PO TABS
1000.0000 mg | ORAL_TABLET | Freq: Four times a day (QID) | ORAL | Status: DC | PRN
  Administered 2014-06-23: 1000 mg via ORAL
  Filled 2014-06-23: qty 2

## 2014-06-23 MED ORDER — METHYLERGONOVINE MALEATE 0.2 MG PO TABS: 0.2000 mg | ORAL_TABLET | ORAL | Status: DC | PRN

## 2014-06-23 MED ORDER — WITCH HAZEL-GLYCERIN EX PADS
1.0000 "application " | MEDICATED_PAD | CUTANEOUS | Status: DC | PRN
  Administered 2014-06-23: 1 via TOPICAL

## 2014-06-23 MED ORDER — SIMETHICONE 80 MG PO CHEW: 80.0000 mg | CHEWABLE_TABLET | ORAL | Status: DC | PRN

## 2014-06-23 MED ORDER — DIPHENHYDRAMINE HCL 25 MG PO CAPS: 25.0000 mg | ORAL_CAPSULE | Freq: Four times a day (QID) | ORAL | Status: DC | PRN

## 2014-06-23 MED ORDER — METHYLERGONOVINE MALEATE 0.2 MG/ML IJ SOLN: 0.2000 mg | INTRAMUSCULAR | Status: DC | PRN

## 2014-06-23 MED ORDER — ONDANSETRON HCL 4 MG/2ML IJ SOLN: 4.0000 mg | INTRAMUSCULAR | Status: DC | PRN

## 2014-06-23 MED ORDER — DIBUCAINE 1 % RE OINT
1.0000 "application " | TOPICAL_OINTMENT | RECTAL | Status: DC | PRN
  Administered 2014-06-23: 1 via RECTAL
  Filled 2014-06-23: qty 28

## 2014-06-23 NOTE — Lactation Note (Signed)
This note was copied from the chart of Kathryn Walters. Lactation Consultation Note Initial visit at 6 hours of age.  Mom reports baby has been sleepy and has not breast fed.  Baby is STS on breast with mouth open, but not latched.  Mom reports trying for 10 minutes but baby doesn't latch.  Mom was given shells and hand pump [facility].  Mom has flat nipples with dried colostrum stuck to middle of nipples.  Attempted to remove with hand expression and light pressure, no visible colostrum noted and dried colostrum only flaked a little and remains in place on nipples.  Mom is laid back but appears to have somewhat wide spaced breast with fertility problems.  Mom reports breast grew in size during pregnancy and are sensitive.  [facility] resources given and discussed.  Encouraged to feed with early cues on demand.  Early newborn behavior discussed.Mom is very drowsy after a long labor and complicated delivery and will need reinforcement on basics.  Discussed positioning as mom was holding baby in cradle hold on right breast.    Mom to call for assist as needed.    Patient Name: Kathryn Walters ESPQZ'RToday's Date: 06/23/2014 Reason for consult: Initial assessment   Maternal Data Has patient been taught Hand Expression?: Yes Does the patient have breastfeeding experience prior to this delivery?: No  Feeding Feeding Type: Breast Fed  LATCH Score/Interventions Latch: Too sleepy or reluctant, no latch achieved, no sucking elicited. Intervention(s): Teach feeding cues;Waking techniques  Audible Swallowing: None Intervention(s): Hand expression;Skin to skin  Type of Nipple: Flat Intervention(s): Hand pump;Shells  Comfort (Breast/Nipple): Soft / non-tender     Hold (Positioning): Assistance needed to correctly position infant at breast and maintain latch. Intervention(s): Breastfeeding basics reviewed;Support Pillows;Position options;Skin to skin  LATCH Score: 4  Lactation Tools Discussed/Used Date  initiated:: 06/23/14   Consult Status Consult Status: Follow-up Date: 06/24/14 Follow-up type: In-patient    Beverely RisenShoptaw, Arvella MerlesJana Lynn 06/23/2014, 8:18 PM

## 2014-06-24 LAB — CBC
HCT: 27.9 % — ABNORMAL LOW (ref 36.0–46.0)
Hemoglobin: 9.8 g/dL — ABNORMAL LOW (ref 12.0–15.0)
MCH: 33.6 pg (ref 26.0–34.0)
MCHC: 35.1 g/dL (ref 30.0–36.0)
MCV: 95.5 fL (ref 78.0–100.0)
PLATELETS: 273 10*3/uL (ref 150–400)
RBC: 2.92 MIL/uL — AB (ref 3.87–5.11)
RDW: 14.4 % (ref 11.5–15.5)
WBC: 15.4 10*3/uL — ABNORMAL HIGH (ref 4.0–10.5)

## 2014-06-24 MED ORDER — OXYCODONE-ACETAMINOPHEN 5-325 MG PO TABS: 1.0000 | ORAL_TABLET | Freq: Four times a day (QID) | ORAL | Status: AC | PRN

## 2014-06-24 NOTE — Progress Notes (Signed)
Patient is doing well.  She is tolerating PO, ambulating, voiding.  Pain is controlled.  Lochia is appropriate  Filed Vitals:   06/23/14 1549 06/23/14 1714 06/23/14 2057 06/24/14 0454  BP: 98/58 119/60 119/61 111/65  Pulse: 98 108 91 98  Temp: 97.5 F (36.4 C) 98.5 F (36.9 C) 98.2 F (36.8 C) 98.1 F (36.7 C)  TempSrc: Axillary Axillary Oral   Resp: 20 20 18    Height:      Weight:      SpO2: 100% 100%      NAD Abdomen:  soft, fundus firm ext:    Symmetric, 1+ edema bilaterally, no calf tenderness  Lab Results  Component Value Date   WBC 15.4* 06/24/2014   HGB 9.8* 06/24/2014   HCT 27.9* 06/24/2014   MCV 95.5 06/24/2014   PLT 273 06/24/2014    --/--/O POS, O POS (02/15 2000)/RImmune  A/P    39 y.o. G1P1001 PPD#1 s/p VAVD Routine post op and postpartum care.   3rd degree laceration: reviewed importance of strict bowel regimen to avoid constipation/straining.  Will have f/u w Dr. Henderson CloudHorvath in office in ~10-14d Desires d/c today after 24 hrs.   Declines circ.

## 2014-06-24 NOTE — Discharge Instructions (Signed)
Pelvic rest x 6 weeks (no intercourse or tampons).  No tub baths or swimming for two weeks.   Do not drive until you are not taking narcotic pain medication AND you can comfortably slam on the brakes.  You have a mild anemia due to blood loss from delivery.  Please continue to take a prenatal vitamin daily with iron.   Due to your third degree perineal laceration, it is very important to avoid constipation or straining with bowel movements.  Stay well hydrated, take colace twice daily and add miralax as needed.   Please call the office if you have a temperature >101 F, heavy vaginal bleeding, worsening perineal pain, severe chest pain or shortness of breath, dizziness, unilateral leg pain or swelling or any other concerns.

## 2014-06-24 NOTE — Lactation Note (Signed)
This note was copied from the chart of Kathryn Walters Fraley. Lactation Consultation Note New mom w/no BF experience. Having difficulty latching baby. Has flat nipples, pull out a little but not enough for baby to latch. Has #20 NS fits well. Demonstrated how to apply properly. D/t mom's vaginal and rectal area hurting so, assisted In side lying position. Obtained deep latch, but baby bites and chews. Assessed suckling and doesn't suck. Bites hard, mom about in tears, adjusted baby's latch, had wide flange but bites and chews hard instead of suckling. Suck training w/gloved finger gave 7ml Alementum d/t baby hadn't eatten all day per mom. No colostrum noted in NS. Hand expression done, no colostrum noted. **Mom has generalized edema. Noted breast to have wide space between them but noted center between breast, the tissue dips down, but appears to be slightly connected. Puffy tissue between breast.** Mom encouraged to feed baby 8-12 times/24 hours and with feeding cues. Mom encouraged to do skin-to-skin. Educated about newborn behavior. Mom shown how to use DEBP & how to disassemble, clean, & reassemble parts.Mom placed in shells and encouraged to wear them between feedings. Comfort gels given for sore nipples.   Patient Name: Kathryn Walters Gutmann ZHYQM'VToday's Date: 06/24/2014 Reason for consult: Follow-up assessment;Breast/nipple pain   Maternal Data    Feeding Feeding Type: Breast Fed Length of feed: 15 min  LATCH Score/Interventions Latch: Repeated attempts needed to sustain latch, nipple held in mouth throughout feeding, stimulation needed to elicit sucking reflex. Intervention(s): Skin to skin;Teach feeding cues;Waking techniques Intervention(s): Breast massage;Breast compression;Assist with latch;Adjust position  Audible Swallowing: A few with stimulation Intervention(s): Skin to skin;Hand expression Intervention(s): Alternate breast massage  Type of Nipple: Flat Intervention(s): Shells;Double  electric pump  Comfort (Breast/Nipple): Engorged, cracked, bleeding, large blisters, severe discomfort Problem noted: Cracked, bleeding, blisters, bruises Intervention(s): Reverse pressure;Double electric pump  Interventions (Mild/moderate discomfort): Comfort gels;Post-pump;Breast shields;Reverse pressue;Hand expression;Hand massage  Hold (Positioning): Assistance needed to correctly position infant at breast and maintain latch. Intervention(s): Breastfeeding basics reviewed;Support Pillows;Position options;Skin to skin  LATCH Score: 4  Lactation Tools Discussed/Used Tools: Shells;Nipple Shields;Pump;Comfort gels Nipple shield size: 20 Shell Type: Inverted Breast pump type: Double-Electric Breast Pump Pump Review: Setup, frequency, and cleaning;Milk Storage Initiated by:: Peri JeffersonL. Zyren Sevigny RN Date initiated:: 06/24/14   Consult Status Consult Status: Follow-up Date: 06/24/14 Follow-up type: In-patient    Charyl DancerCARVER, Jalaiyah Throgmorton G 06/24/2014, 2:54 AM

## 2014-06-24 NOTE — Anesthesia Postprocedure Evaluation (Signed)
  Anesthesia Post-op Note  Patient: Kathryn MossMarsela Maalouf  Procedure(s) Performed: * No procedures listed *  Patient Location: Mother/Baby  Anesthesia Type:Epidural  Level of Consciousness: awake, alert , oriented and patient cooperative  Airway and Oxygen Therapy: Patient Spontanous Breathing  Post-op Pain: none  Post-op Assessment: Post-op Vital signs reviewed, Patient's Cardiovascular Status Stable, Respiratory Function Stable, Patent Airway, No headache, No backache, No residual numbness and No residual motor weakness  Post-op Vital Signs: Reviewed and stable  Last Vitals:  Filed Vitals:   06/24/14 0454  BP: 111/65  Pulse: 98  Temp: 36.7 C  Resp:     Complications: No apparent anesthesia complications

## 2014-06-24 NOTE — Lactation Note (Signed)
This note was copied from the chart of Kathryn Vonita MossMarsela Tuohey. Lactation Consultation Note; Second attempt to follow up with mother today. Infant was getting a hearing screen and now mother is sleeping. Staff nurse reports that mother is feeding infant formula with a syringe. She states that mother states that her nipples are sore and she is taking a break from breastfeeding. Mother has comfort gels and was using a nipple shield. Mother has a DEBP at the bedside. Plan is to follow up again to assist with supplementation and pumping.   Patient Name: Kathryn Vonita MossMarsela Lun UXLKG'MToday's Date: 06/24/2014     Maternal Data    Feeding Feeding Type: Formula  LATCH Score/Interventions                      Lactation Tools Discussed/Used     Consult Status      Kathryn Walters, Kathryn Walters 06/24/2014, 2:06 PM

## 2014-06-24 NOTE — Progress Notes (Signed)
Baby not able to be discharged, so will hold mom's d/c until tomorrow.

## 2014-06-24 NOTE — Lactation Note (Signed)
This note was copied from the chart of Kathryn Vonita MossMarsela Lightsey. Lactation Consultation Note: Mother requesting assistance with latch. Lt nipple has a bruise and small crack. Right nipple is pink with tissue intact. Mothers tissue is still swollen. O Mother describes infant chewing and biting. Infant has a very arched high palate with prominent upper gum.  Mother was taught suck training. Infant suckles with smooth sucking action on LC's and mother's finger without any biting.  A #24 nipple shield was placed on mother's Rt nipple and filled with formula. Infant was given 15 ml of formula through the nipple shield. Mother described #4 pain only on the initial latch with a #2 through the feeding. Infant suckled with good pattern while being given formula. Mother was given plan to pump every 2-3 hours for 15-20 mins. Advised mother to begin increasing supplement amts and feed infant every 2-3 hours at least 8-12 feeding in 24 hours.  Mother has supplement guidelines at bedside.  Staff nurse in to see mother. Advised that mother will not have family support this evening and will need assistance. Mother receptive to all teaching.   Patient Name: Kathryn Walters LKGMW'NToday's Date: 06/24/2014 Reason for consult: Follow-up assessment   Maternal Data    Feeding Feeding Type: Formula  LATCH Score/Interventions Latch: Repeated attempts needed to sustain latch, nipple held in mouth throughout feeding, stimulation needed to elicit sucking reflex. Intervention(s):  (suck training) Intervention(s): Adjust position;Assist with latch;Breast massage  Audible Swallowing: Spontaneous and intermittent (while using formula with syringe) Intervention(s): Hand expression Intervention(s): Hand expression  Type of Nipple: Everted at rest and after stimulation Intervention(s): Double electric pump  Comfort (Breast/Nipple): Filling, red/small blisters or bruises, mild/mod discomfort Problem noted: Cracked, bleeding, blisters,  bruises (crack only on the Lt nipple, Rt nipple pink) Intervention(s): Expressed breast milk to nipple  Problem noted: Mild/Moderate discomfort Interventions (Mild/moderate discomfort): Comfort gels;Breast shields Interventions (Severe discomfort): Flange size (switched to #27 flange)  Hold (Positioning): Assistance needed to correctly position infant at breast and maintain latch. Intervention(s): Support Pillows;Position options  LATCH Score: 7  Lactation Tools Discussed/Used Tools: Nipple Shields Nipple shield size: 24 Breast pump type: Double-Electric Breast Pump   Consult Status Consult Status: Follow-up Date: 06/24/14 Follow-up type: In-patient    Stevan BornKendrick, Kosei Rhodes [facility] 06/24/2014, 5:57 PM

## 2014-06-25 ENCOUNTER — Ambulatory Visit: Payer: Self-pay

## 2014-06-25 NOTE — Discharge Summary (Signed)
Obstetric Discharge Summary Reason for Admission: induction of labor Prenatal Procedures: ultrasound Intrapartum Procedures: spontaneous vaginal delivery Postpartum Procedures: none Complications-Operative and Postpartum: partial 3rd degree perineal laceration HEMOGLOBIN  Date Value Ref Range Status  06/24/2014 9.8* 12.0 - 15.0 g/dL Final   HCT  Date Value Ref Range Status  06/24/2014 27.9* 36.0 - 46.0 % Final    Physical Exam:  General: alert and cooperative Lochia: appropriate Uterine Fundus: firm DVT Evaluation: No evidence of DVT seen on physical exam.  Discharge Diagnoses: Term Pregnancy-delivered  Discharge Information: Date: 06/25/2014 Activity: pelvic rest Diet: routine Medications: PNV and Percocet Condition: stable Instructions: refer to practice specific booklet Discharge to: home Follow-up Information    Follow up with HORVATH,MICHELLE A, MD In 10 days.   Specialty:  Obstetrics and Gynecology   Contact information:   [address] [place] [address] [phone]       Follow up with HORVATH,MICHELLE A, MD In 4 weeks.   Specialty:  Obstetrics and Gynecology   Contact information:   [address] [place] [address] [phone]       Newborn Data: Live born female  Birth Weight: 7 lb 8.1 oz (3405 g) APGAR: 8,   Home with mother.  Philip AspenCALLAHAN, Jlyn Bracamonte 06/25/2014, 10:35 AM

## 2014-06-25 NOTE — Lactation Note (Signed)
This note was copied from the chart of Kathryn Walters. Lactation Consultation Note: Mother has been breastfeeding on the Rt breast using the #24 nipple shield and supplementing infant with a curved tip syringe. Mother has not offered the Left breast again yet due to soreness. Mother is continuing to use shells and comfort gels. Advised to continue to hand express. She has been pumping with minimal results. Lot of teaching on consistent pumping , supply and demand and protecting her milk supply. Mother was offered a 2 week rental. She declines stating that she plans to by a Medela electric today from [employer]. Advised to also purchase a bottle with a wide base that is slow flow. Mother was given another copy of supplemental guidelines and advised to breastfeed infant and supplement infant as needed. Treatment plan given to prevent engorgement. [facility] was [facility] on Tuesday February 23 at 2:30. Mother is aware of available LC hotline.   Patient Name: Kathryn Vonita MossMarsela Kluck ZOXWR'UToday's Date: 06/25/2014 Reason for consult: Follow-up assessment   Maternal Data    Feeding    LATCH Score/Interventions                      Lactation Tools Discussed/Used     Consult Status Consult Status: Follow-up Date: 06/29/14 Follow-up type: Out-patient    Stevan BornKendrick, [facility] 06/25/2014, 11:37 AM

## 2014-06-29 ENCOUNTER — Ambulatory Visit (HOSPITAL_COMMUNITY): Payer: BLUE CROSS/BLUE SHIELD

## 2014-07-01 ENCOUNTER — Ambulatory Visit (HOSPITAL_COMMUNITY)
Admission: RE | Admit: 2014-07-01 | Discharge: 2014-07-01 | Disposition: A | Discharge status: Home / Self Care | Payer: BLUE CROSS/BLUE SHIELD | Source: Ambulatory Visit | Attending: Obstetrics & Gynecology | Admitting: Obstetrics & Gynecology

## 2014-07-01 ENCOUNTER — Encounter (HOSPITAL_COMMUNITY): Payer: Self-pay

## 2014-07-01 NOTE — Lactation Note (Addendum)
Lactation Consult  Mother's reason for visit: feeding assessment Visit Type:  Initial  Appointment Notes:  Mother is continuing to use a #24 nipple shield. She still describes pain scale of # 4-5 with initial latch and on and off intermittently during the feeding. Mother is able to flange infants lips for wider gape with some relief. Mother pumps at least 6 times daily. Infant is being supplemented with formula approx 60 ml every 2-3 hours with a bottle. Mother states she gives a bottle every other feeding.Mother is 41 yrs old and has has long history of infertillity. She has been on multiple medications as well as  hormone injections . She was on and off clomid for 3 years. Once she began endometrium and p-rovel injections she became pregnant in 3 months. Mother is mostly bottle feeding and pumping. She has had bilateral cracked nipples that are now healed.  Consult:  Initial Lactation Consultant:  Michel BickersKendrick, Brittne Kawasaki McCoy  ________________________________________________________________________    ________________________________________________________________________  Mother's Name: Kathryn Walters Type of delivery:  vaginal del Breastfeeding Experience:  none Maternal Medical Conditions:  idiopathic infertility Maternal Medications:  Prenatal vits  ________________________________________________________________________  Breastfeeding History (Post Discharge)  Frequency of breastfeeding:  1-2 times daily Duration of feeding:  20-30 mins    Pumping  Type of pump:  Medela pump in style Frequency:  6 times daily Volume:  10-30 ml  Infant Intake and Output Assessment  Voids:  8-12 in 24 hrs.  Color:  Clear yellow Stools:  8 in 24 hrs.  Color:  Brown and Yellow  ________________________________________________________________________  Maternal Breast Assessment  Breast:  Filling Nipple:  Erect Pain level:  2 Pain interventions:   Binder  _______________________________________________________________________ Feeding Assessment/Evaluation: assist mother with latching infant using a #24 nipple shield. Infant continues to purse lips and need lower jaw and upper lip adjusted. Infant latched on for 20 mins with non nurtritive suckling. Nipple shield was dry when infant released the breast. A few drops of colostrum present on the tip of mothers nipple after feeding.    Positioning:  Cross cradle Left breast  LATCH documentation:  Latch:  2 = Grasps breast easily, tongue down, lips flanged, rhythmical sucking.  Audible swallowing:  2 = Spontaneous and intermittent  Type of nipple:  2 = Everted at rest and after stimulation  Comfort (Breast/Nipple):  1 = Filling, red/small blisters or bruises, mild/mod discomfort  Hold (Positioning):  1 = Assistance needed to correctly position infant at breast and maintain latch  LATCH score:  8  Attached assessment:  Shallow  Lips flanged:  Yes.    Lips untucked:  Yes.    Suck assessment:  Nutritive and Nonnutritive  Tools:  Nipple shield 24 mm Instructed on use and cleaning of tool:  Yes.    Pre-feed weight:  3534 g  (7 lb. 12.4 oz.) Post-feed weight:3534  g ( lb.  oz.) Amount transferred:   noneml   Additional Feeding Assessment - SNS sat up with 30 ml of enfamil. Infant latched with a #24 nipple shield and transferred entire amt with no transfer of mothers milk.   Infant's oral assessment:  Variance, infant has a anterior, and a  possible posterior frenula, infants tongue cups and had limited elevation.  Positioning:  Cross cradle Right breast  LATCH documentation:  Latch:  2 = Grasps breast easily, tongue down, lips flanged, rhythmical sucking.  Audible swallowing:  2 = Spontaneous and intermittent  Type of nipple:  2 = Everted at rest and after  stimulation  Comfort (Breast/Nipple):  1 = Filling, red/small blisters or bruises, mild/mod discomfort  Hold (Positioning):   1 = Assistance needed to correctly position infant at breast and maintain latch  LATCH score:  8  Attached assessment:  Shallow  Lips flanged:  No.  Lips untucked:  No.  Suck assessment:  Nonnutritive  Tools:  Nipple shield 24 mm and Supplemental nutrition system Instructed on use and cleaning of tool:  Yes.    Pre-feed weight:  3560 g  (7 lb. 12.6oz.) Post-feed weight:  3560g (7 lb. 13.6oz.) Amount transferred:  ml Amount supplemented:  30  ML was given with SNS.   Total amount pumped post feed:  R  ml    L  ml  Total amount transferred:  None  Total supplement given:  90 ml, 30 with SNS and 60 ml with a bottle, Mother is using an Avent slow flow bottle  Mother advised to continue to feed infant every 2-3 hours Suggested that she use SNS at least 1-2 times daily or more often with the #24 nipple shield Mother to continue to supplement infant EBM/formula after each breastfeeding at least 60 ml Advised mother to pump 6-8 times daily for 20 mins Mother to get supplement to compliment milk supply( mothers Milk Plus) Mother was given information on tongue tie. She was advised to follow up in one week for weight check  Lots of support and encouragement given to mother . Mother receptive to all teaching. Mother will phone LC office to schedule out patient visit for next week.

## 2014-07-27 ENCOUNTER — Other Ambulatory Visit: Payer: Self-pay | Admitting: Obstetrics and Gynecology

## 2014-07-28 LAB — CYTOLOGY - PAP

## 2015-04-26 ENCOUNTER — Telehealth: Payer: Self-pay | Admitting: *Deleted

## 2015-04-26 NOTE — Telephone Encounter (Signed)
[facility]. Forwarded to [place] to scan/email to medical records. JG//CMA

## 2016-04-13 DIAGNOSIS — Z3141 Encounter for fertility testing: Secondary | ICD-10-CM | POA: Diagnosis not present

## 2016-04-25 DIAGNOSIS — Z3141 Encounter for fertility testing: Secondary | ICD-10-CM | POA: Diagnosis not present

## 2016-05-29 DIAGNOSIS — Z3141 Encounter for fertility testing: Secondary | ICD-10-CM | POA: Diagnosis not present

## 2016-06-13 DIAGNOSIS — Z3200 Encounter for pregnancy test, result unknown: Secondary | ICD-10-CM | POA: Diagnosis not present

## 2017-07-18 DIAGNOSIS — H04123 Dry eye syndrome of bilateral lacrimal glands: Secondary | ICD-10-CM | POA: Diagnosis not present

## 2017-12-20 DIAGNOSIS — R232 Flushing: Secondary | ICD-10-CM | POA: Diagnosis not present

## 2018-01-09 DIAGNOSIS — J029 Acute pharyngitis, unspecified: Secondary | ICD-10-CM | POA: Diagnosis not present

## 2018-01-09 DIAGNOSIS — R300 Dysuria: Secondary | ICD-10-CM | POA: Diagnosis not present

## 2018-07-08 ENCOUNTER — Other Ambulatory Visit: Payer: Self-pay | Admitting: Family Medicine

## 2018-07-08 DIAGNOSIS — Z1231 Encounter for screening mammogram for malignant neoplasm of breast: Secondary | ICD-10-CM

## 2018-08-01 ENCOUNTER — Ambulatory Visit: Payer: BLUE CROSS/BLUE SHIELD

## 2018-09-02 ENCOUNTER — Ambulatory Visit: Payer: BLUE CROSS/BLUE SHIELD

## 2018-10-08 ENCOUNTER — Ambulatory Visit: Payer: BLUE CROSS/BLUE SHIELD

## 2018-11-06 DIAGNOSIS — Z1211 Encounter for screening for malignant neoplasm of colon: Secondary | ICD-10-CM | POA: Diagnosis not present

## 2018-11-06 DIAGNOSIS — K625 Hemorrhage of anus and rectum: Secondary | ICD-10-CM | POA: Diagnosis not present

## 2018-11-06 DIAGNOSIS — R140 Abdominal distension (gaseous): Secondary | ICD-10-CM | POA: Diagnosis not present

## 2018-11-06 DIAGNOSIS — K5904 Chronic idiopathic constipation: Secondary | ICD-10-CM | POA: Diagnosis not present

## 2018-12-05 DIAGNOSIS — Z01419 Encounter for gynecological examination (general) (routine) without abnormal findings: Secondary | ICD-10-CM | POA: Diagnosis not present

## 2018-12-05 DIAGNOSIS — Z6824 Body mass index (BMI) 24.0-24.9, adult: Secondary | ICD-10-CM | POA: Diagnosis not present

## 2018-12-05 DIAGNOSIS — Z124 Encounter for screening for malignant neoplasm of cervix: Secondary | ICD-10-CM | POA: Diagnosis not present

## 2018-12-15 ENCOUNTER — Other Ambulatory Visit: Payer: Self-pay

## 2018-12-15 ENCOUNTER — Ambulatory Visit
Admission: RE | Admit: 2018-12-15 | Discharge: 2018-12-15 | Disposition: A | Discharge status: Home / Self Care | Payer: BC Managed Care – PPO | Source: Ambulatory Visit | Attending: Family Medicine | Admitting: Family Medicine

## 2018-12-15 DIAGNOSIS — Z1231 Encounter for screening mammogram for malignant neoplasm of breast: Secondary | ICD-10-CM | POA: Diagnosis not present

## 2018-12-26 DIAGNOSIS — K625 Hemorrhage of anus and rectum: Secondary | ICD-10-CM | POA: Diagnosis not present

## 2018-12-26 DIAGNOSIS — Z1211 Encounter for screening for malignant neoplasm of colon: Secondary | ICD-10-CM | POA: Diagnosis not present

## 2019-03-06 DIAGNOSIS — Z23 Encounter for immunization: Secondary | ICD-10-CM | POA: Diagnosis not present

## 2020-01-01 DIAGNOSIS — Z20822 Contact with and (suspected) exposure to covid-19: Secondary | ICD-10-CM | POA: Diagnosis not present

## 2020-03-23 DIAGNOSIS — Z23 Encounter for immunization: Secondary | ICD-10-CM | POA: Diagnosis not present

## 2020-04-05 ENCOUNTER — Other Ambulatory Visit: Payer: Self-pay | Admitting: Family Medicine

## 2020-04-05 DIAGNOSIS — Z1231 Encounter for screening mammogram for malignant neoplasm of breast: Secondary | ICD-10-CM

## 2020-04-07 ENCOUNTER — Ambulatory Visit
Admission: RE | Admit: 2020-04-07 | Discharge: 2020-04-07 | Disposition: A | Discharge status: Home / Self Care | Payer: BC Managed Care – PPO | Source: Ambulatory Visit | Attending: Family Medicine | Admitting: Family Medicine

## 2020-04-07 ENCOUNTER — Other Ambulatory Visit: Payer: Self-pay

## 2020-04-07 DIAGNOSIS — Z1231 Encounter for screening mammogram for malignant neoplasm of breast: Secondary | ICD-10-CM

## 2020-10-31 IMAGING — MG DIGITAL SCREENING BILATERAL MAMMOGRAM WITH TOMO AND CAD
8 series · 9 of 24 positions shown · non-contrast
Comparison: None.

CLINICAL DATA: Screening.

EXAM:
DIGITAL SCREENING BILATERAL MAMMOGRAM WITH TOMO AND CAD

[L CC synth-2D]
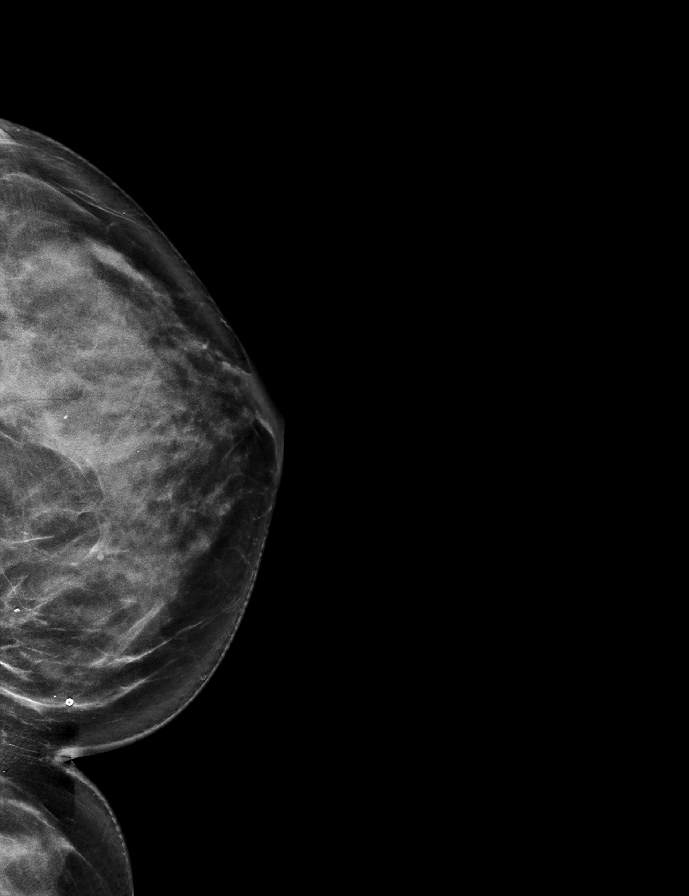

[L MLO synth-2D]
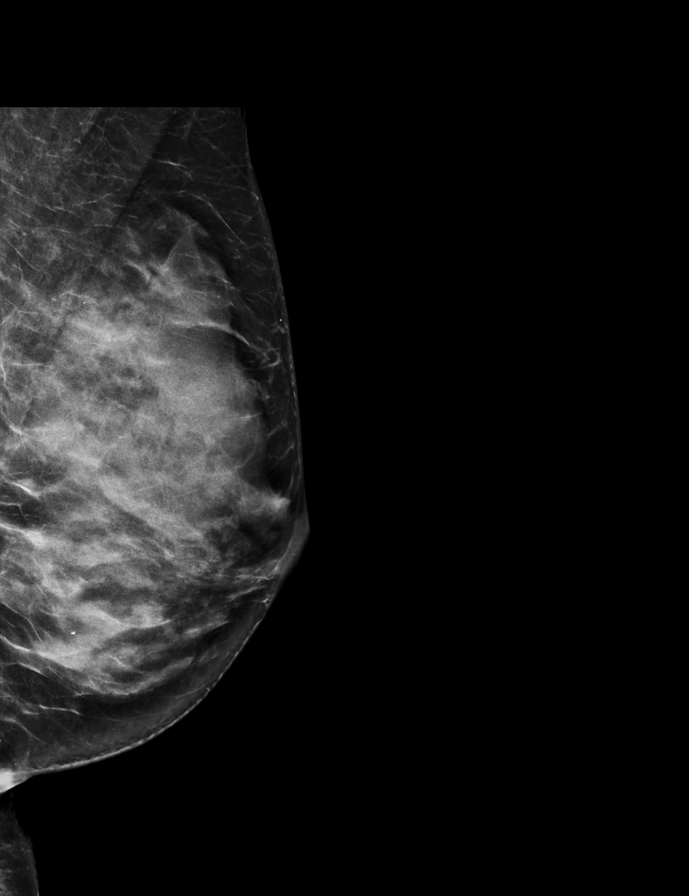

[R MLO synth-2D]
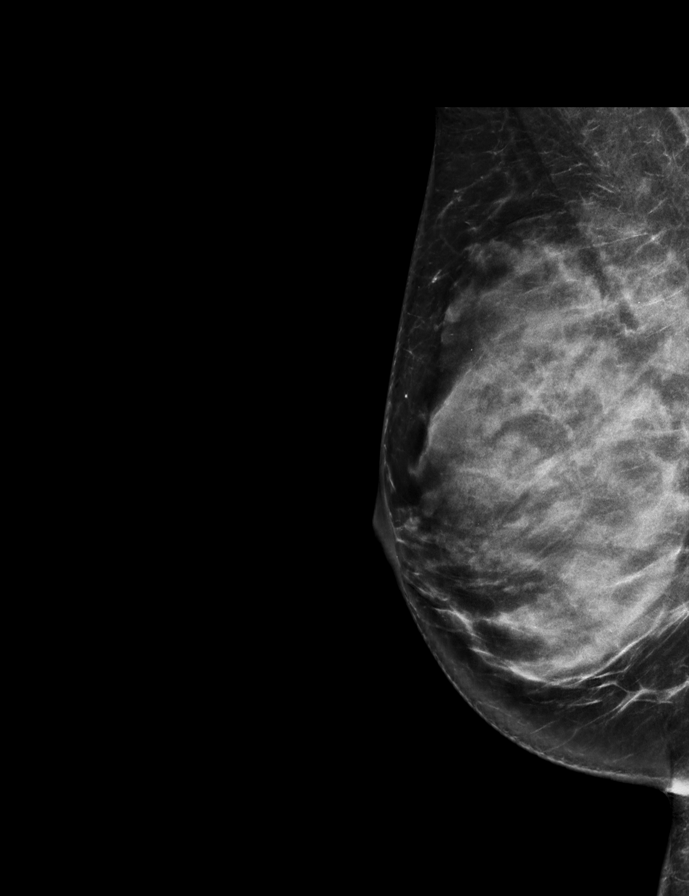

[R CC synth-2D]
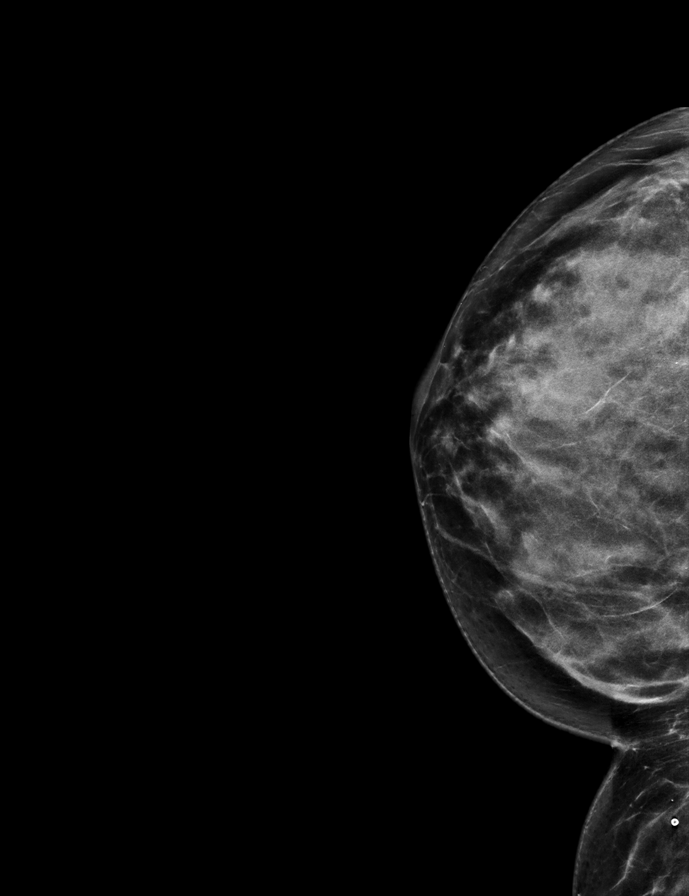

[L MLO tomo · 2 of 67 frames shown]
[frame 22/67]
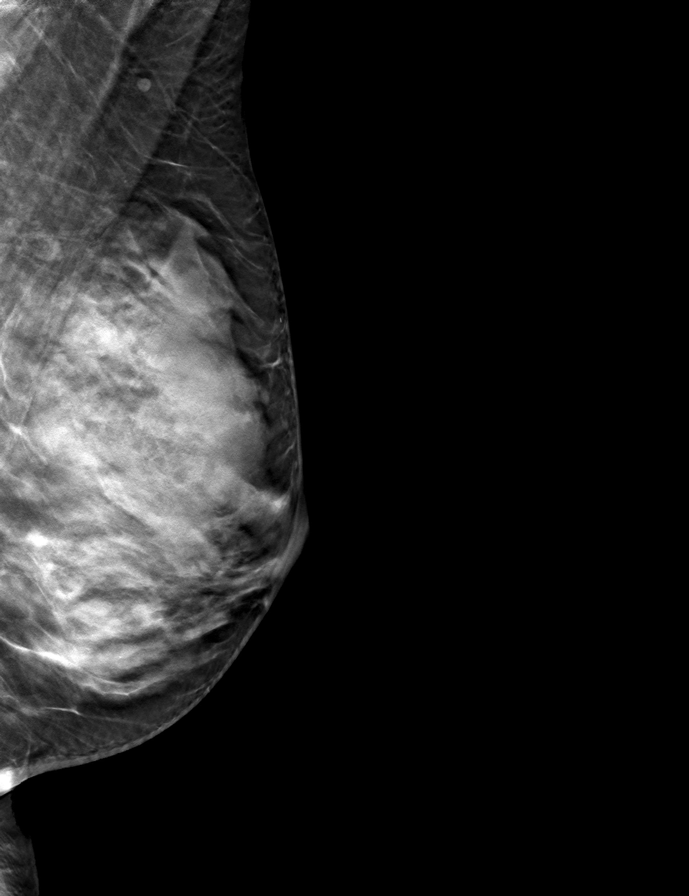
[frame 34/67]
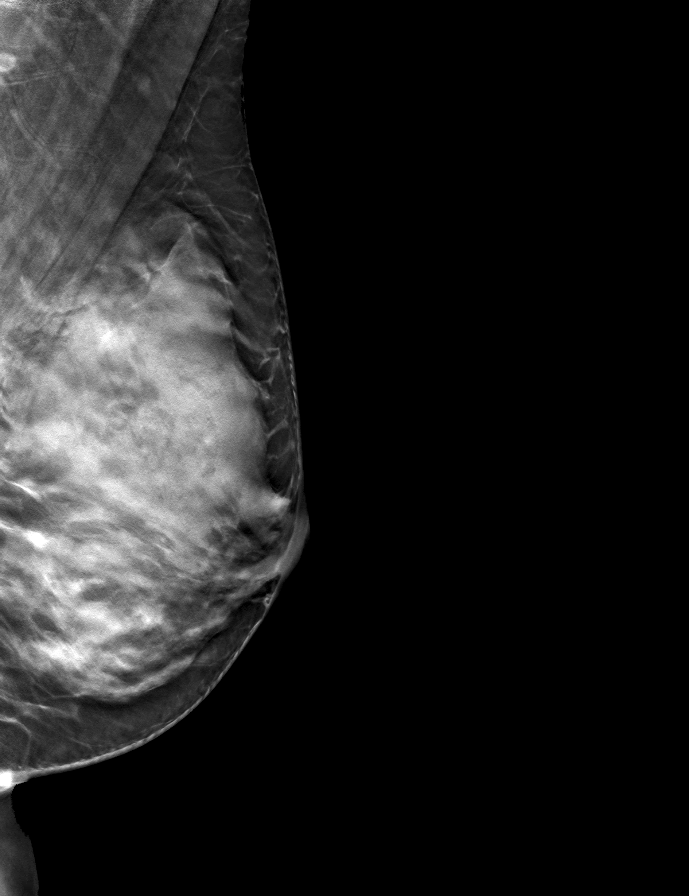

[R CC tomo · tomo slice 31/61.0]
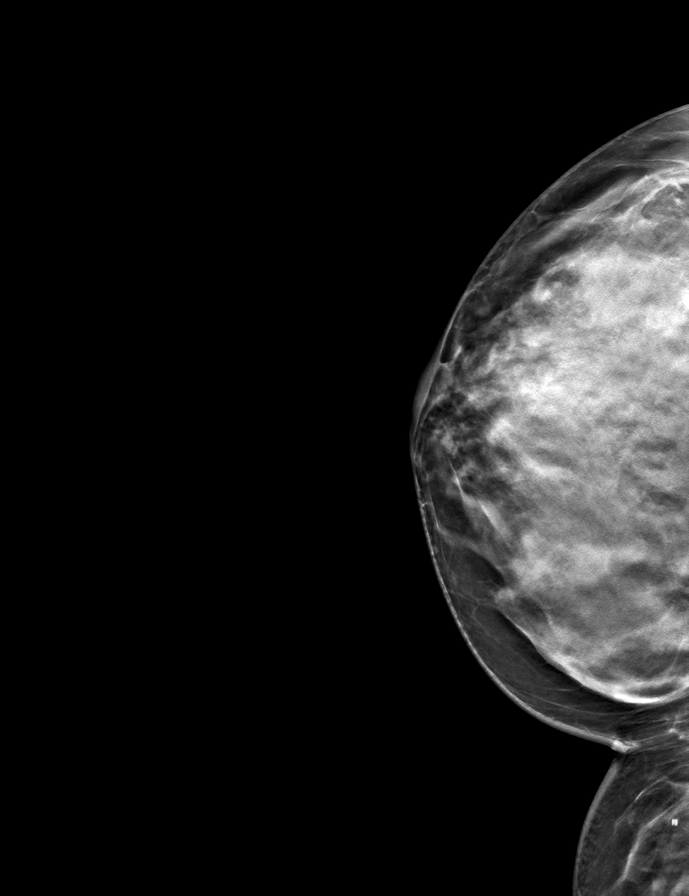

[L CC tomo · tomo slice 34/67.0]
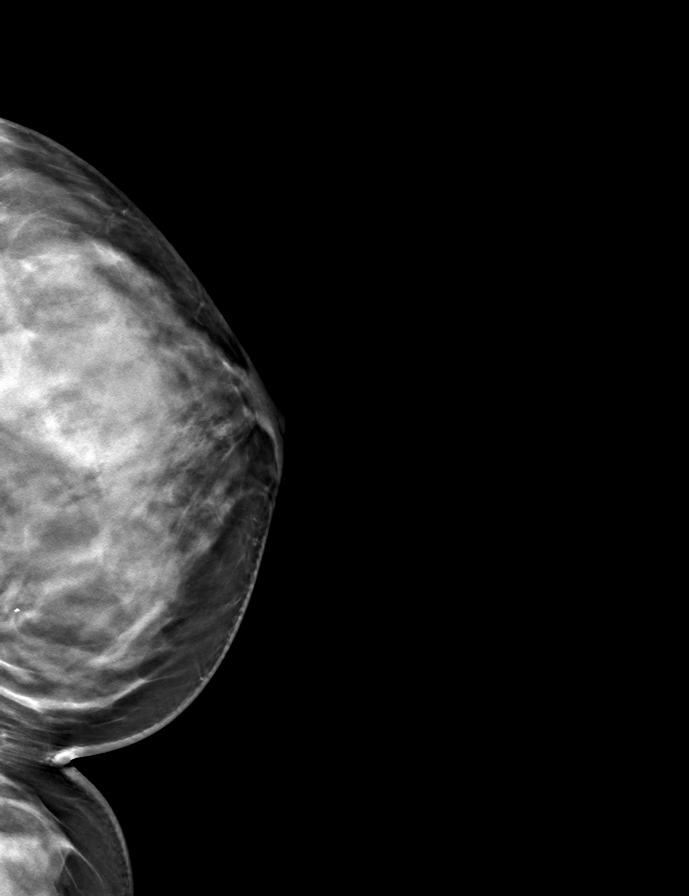

[R MLO tomo · tomo slice 35/70.0]
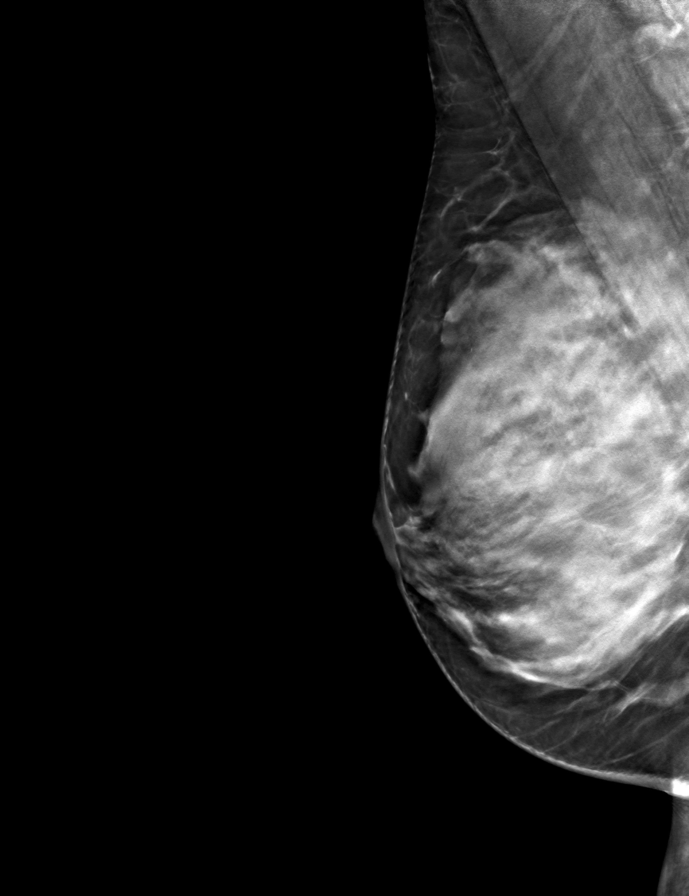

[9 of 24 positions shown; findings below may reference images not displayed]

ACR Breast Density Category d: The breast tissue is extremely dense,
which lowers the sensitivity of mammography.
FINDINGS: There are no findings suspicious for malignancy. Images were
processed with CAD.
IMPRESSION: No mammographic evidence of malignancy. A result letter of this
screening mammogram will be mailed directly to the patient.

RECOMMENDATION:
Screening mammogram in one year. (Code:F8-6-TBV)

BI-RADS CATEGORY  1: Negative.

## 2021-02-08 DIAGNOSIS — Z23 Encounter for immunization: Secondary | ICD-10-CM | POA: Diagnosis not present

## 2021-03-24 ENCOUNTER — Other Ambulatory Visit: Payer: Self-pay | Admitting: Family Medicine

## 2021-03-24 DIAGNOSIS — Z1231 Encounter for screening mammogram for malignant neoplasm of breast: Secondary | ICD-10-CM

## 2021-04-27 ENCOUNTER — Ambulatory Visit
Admission: RE | Admit: 2021-04-27 | Discharge: 2021-04-27 | Disposition: A | Discharge status: Home / Self Care | Payer: BC Managed Care – PPO | Source: Ambulatory Visit | Attending: Family Medicine | Admitting: Family Medicine

## 2021-04-27 DIAGNOSIS — Z1231 Encounter for screening mammogram for malignant neoplasm of breast: Secondary | ICD-10-CM | POA: Diagnosis not present

## 2021-06-14 DIAGNOSIS — Z1329 Encounter for screening for other suspected endocrine disorder: Secondary | ICD-10-CM | POA: Diagnosis not present

## 2021-06-14 DIAGNOSIS — R5383 Other fatigue: Secondary | ICD-10-CM | POA: Diagnosis not present

## 2021-06-20 DIAGNOSIS — D649 Anemia, unspecified: Secondary | ICD-10-CM | POA: Diagnosis not present

## 2021-06-20 DIAGNOSIS — R5383 Other fatigue: Secondary | ICD-10-CM | POA: Diagnosis not present

## 2021-08-24 DIAGNOSIS — R140 Abdominal distension (gaseous): Secondary | ICD-10-CM | POA: Diagnosis not present

## 2021-08-24 DIAGNOSIS — K5904 Chronic idiopathic constipation: Secondary | ICD-10-CM | POA: Diagnosis not present

## 2021-12-06 ENCOUNTER — Other Ambulatory Visit: Payer: Self-pay | Admitting: Family Medicine

## 2021-12-06 DIAGNOSIS — Z1231 Encounter for screening mammogram for malignant neoplasm of breast: Secondary | ICD-10-CM

## 2022-05-14 ENCOUNTER — Ambulatory Visit
Admission: RE | Admit: 2022-05-14 | Discharge: 2022-05-14 | Disposition: A | Discharge status: Home / Self Care | Payer: Commercial Managed Care - PPO | Source: Ambulatory Visit | Attending: Family Medicine | Admitting: Family Medicine

## 2022-05-14 DIAGNOSIS — Z1231 Encounter for screening mammogram for malignant neoplasm of breast: Secondary | ICD-10-CM

## 2022-05-15 ENCOUNTER — Other Ambulatory Visit: Payer: Self-pay | Admitting: Family Medicine

## 2022-05-15 DIAGNOSIS — R928 Other abnormal and inconclusive findings on diagnostic imaging of breast: Secondary | ICD-10-CM

## 2022-05-23 ENCOUNTER — Ambulatory Visit
Admission: RE | Admit: 2022-05-23 | Discharge: 2022-05-23 | Disposition: A | Discharge status: Home / Self Care | Payer: Commercial Managed Care - PPO | Source: Ambulatory Visit | Attending: Family Medicine | Admitting: Family Medicine

## 2022-05-23 DIAGNOSIS — R928 Other abnormal and inconclusive findings on diagnostic imaging of breast: Secondary | ICD-10-CM

## 2023-05-15 ENCOUNTER — Other Ambulatory Visit: Payer: Self-pay | Admitting: Family Medicine

## 2023-05-15 DIAGNOSIS — Z1231 Encounter for screening mammogram for malignant neoplasm of breast: Secondary | ICD-10-CM

## 2023-06-04 ENCOUNTER — Ambulatory Visit
Admission: RE | Admit: 2023-06-04 | Discharge: 2023-06-04 | Disposition: A | Discharge status: Home / Self Care | Payer: Commercial Managed Care - PPO | Source: Ambulatory Visit | Attending: Family Medicine | Admitting: Family Medicine

## 2023-06-04 DIAGNOSIS — Z1231 Encounter for screening mammogram for malignant neoplasm of breast: Secondary | ICD-10-CM

## 2024-05-13 ENCOUNTER — Other Ambulatory Visit: Payer: Self-pay | Admitting: Family Medicine

## 2024-05-13 DIAGNOSIS — Z1231 Encounter for screening mammogram for malignant neoplasm of breast: Secondary | ICD-10-CM

## 2024-06-05 ENCOUNTER — Ambulatory Visit

## 2024-06-10 ENCOUNTER — Ambulatory Visit

## 2024-06-16 ENCOUNTER — Ambulatory Visit
